# Patient Record
Sex: Female | Born: 1990 | Hispanic: Yes | Marital: Single | Smoking: Never smoker
Health system: Southern US, Community
[De-identification: ages and names within clinical notes are randomized; demographics above are authoritative.]

## PROBLEM LIST (undated history)

## (undated) DIAGNOSIS — Z789 Other specified health status: Secondary | ICD-10-CM

## (undated) HISTORY — PX: NO PAST SURGERIES: SHX2092

## (undated) HISTORY — DX: Other specified health status: Z78.9

---

## 2010-04-10 NOTE — L&D Delivery Note (Signed)
Delivery Note At 7:52 AM a viable female was delivered via Vaginal, Vacuum (Extractor) (Presentation: ; Occiput Anterior).  APGAR: , ; weight .   Placenta status: , .  Cord:  with the following complications: .  Cord pH: not done  Anesthesia: Local Epidural  Episiotomy: Median Lacerations: None Suture Repair: 2.0 vicryl Est. Blood Loss (mL):   Mom to postpartum.  Baby to nursery-stable.  Cam Hai 03/08/2011, 8:06 AM

## 2010-11-06 LAB — RPR: RPR: NONREACTIVE

## 2010-11-06 LAB — ABO/RH: RH Type: POSITIVE

## 2011-02-21 LAB — STREP B DNA PROBE: GBS: NEGATIVE

## 2011-03-06 ENCOUNTER — Inpatient Hospital Stay (HOSPITAL_COMMUNITY)
Admission: EM | Admit: 2011-03-06 | Discharge: 2011-03-10 | DRG: 775 | Disposition: A | Discharge status: Home / Self Care | Payer: Medicaid Other | Attending: Obstetrics | Admitting: Obstetrics

## 2011-03-06 ENCOUNTER — Emergency Department (HOSPITAL_COMMUNITY): Payer: Medicaid Other

## 2011-03-06 ENCOUNTER — Inpatient Hospital Stay (HOSPITAL_COMMUNITY): Payer: Medicaid Other

## 2011-03-06 DIAGNOSIS — S161XXA Strain of muscle, fascia and tendon at neck level, initial encounter: Secondary | ICD-10-CM

## 2011-03-06 DIAGNOSIS — R109 Unspecified abdominal pain: Secondary | ICD-10-CM

## 2011-03-06 DIAGNOSIS — O9A219 Injury, poisoning and certain other consequences of external causes complicating pregnancy, unspecified trimester: Secondary | ICD-10-CM

## 2011-03-06 LAB — TYPE AND SCREEN: Antibody Screen: NEGATIVE

## 2011-03-06 LAB — CBC
MCHC: 35.1 g/dL (ref 30.0–36.0)
Platelets: 241 10*3/uL (ref 150–400)
RDW: 12.2 % (ref 11.5–15.5)
WBC: 10.2 10*3/uL (ref 4.0–10.5)

## 2011-03-06 LAB — COMPREHENSIVE METABOLIC PANEL
ALT: 16 U/L (ref 0–35)
AST: 24 U/L (ref 0–37)
Albumin: 3 g/dL — ABNORMAL LOW (ref 3.5–5.2)
CO2: 21 mEq/L (ref 19–32)
Calcium: 9.7 mg/dL (ref 8.4–10.5)
Chloride: 105 mEq/L (ref 96–112)
GFR calc non Af Amer: >90 mL/min (ref >90)
Sodium: 137 mEq/L (ref 135–145)
Total Bilirubin: 0.1 mg/dL — ABNORMAL LOW (ref 0.3–1.2)

## 2011-03-06 LAB — DIFFERENTIAL
Basophils Absolute: 0 10*3/uL (ref 0.0–0.1)
Basophils Relative: 0 % (ref 0–1)
Lymphocytes Relative: 15 % (ref 12–46)
Neutro Abs: 7.7 10*3/uL (ref 1.7–7.7)

## 2011-03-06 LAB — ABO/RH: ABO/RH(D): O POS

## 2011-03-06 LAB — KLEIHAUER-BETKE STAIN: Quantitation Fetal Hemoglobin: 0 mL

## 2011-03-06 MED ORDER — ACETAMINOPHEN 325 MG PO TABS: 650.0000 mg | ORAL_TABLET | ORAL | Status: DC | PRN

## 2011-03-06 MED ORDER — PRENATAL PLUS 27-1 MG PO TABS
1.0000 | ORAL_TABLET | Freq: Every day | ORAL | Status: DC
  Administered 2011-03-07 – 2011-03-10 (×3): 1 via ORAL
  Filled 2011-03-06 (×3): qty 1

## 2011-03-06 MED ORDER — SODIUM CHLORIDE 0.9 % IV SOLN: 250.0000 mL | INTRAVENOUS | Status: DC | PRN

## 2011-03-06 MED ORDER — SODIUM CHLORIDE 0.9 % IJ SOLN
3.0000 mL | Freq: Two times a day (BID) | INTRAMUSCULAR | Status: DC
  Administered 2011-03-06 – 2011-03-07 (×3): 3 mL via INTRAVENOUS

## 2011-03-06 MED ORDER — CALCIUM CARBONATE ANTACID 500 MG PO CHEW: 2.0000 | CHEWABLE_TABLET | ORAL | Status: DC | PRN

## 2011-03-06 MED ORDER — SODIUM CHLORIDE 0.9 % IJ SOLN: 3.0000 mL | INTRAMUSCULAR | Status: DC | PRN
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
## 2011-03-06 NOTE — Progress Notes (Signed)
Through phone interpreter history obtained. Pt states she [facility]. Pt denies medical history or prenatal history of complications. Pt denies pain and no tenderness with palpation. Pt denies feeling contractions or low back pain. Prenatal record reviewed. Pt denies questions or complaints at this time.

## 2011-03-06 NOTE — Progress Notes (Signed)
Arrived at bedside in room 2 in [facility] ED. Doppler of FHR before pt to xray 135. Pt tearful but denies abd tenderness and mild abd pain. Abd palpates soft. Pt denies feeling FM at this time.
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
## 2011-03-06 NOTE — ED Notes (Signed)
[facility] at bedside. Report given. Paperwork to [facility]link. Vitals remain stable.
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
## 2011-03-06 NOTE — ED Notes (Signed)
Rapid Response OB nurse contacted

## 2011-03-07 MED ORDER — INFLUENZA VIRUS VACC SPLIT PF IM SUSP
0.5000 mL | INTRAMUSCULAR | Status: AC
  Administered 2011-03-09: 0.5 mL via INTRAMUSCULAR
  Filled 2011-03-07 (×2): qty 0.5

## 2011-03-07 NOTE — ED Provider Notes (Signed)
I saw and evaluated the patient, reviewed the resident's note and I agree with the findings and plan.  I was present for entire limited ED [place] FAST and OB noting no apparent free fluid abd and FHR 130s.  Hurman Horn, MD 03/07/11 [phone]

## 2011-03-07 NOTE — Progress Notes (Signed)
UR Chart review completed.  

## 2011-03-08 ENCOUNTER — Inpatient Hospital Stay (HOSPITAL_COMMUNITY): Payer: Medicaid Other | Admitting: Anesthesiology

## 2011-03-08 ENCOUNTER — Encounter (HOSPITAL_COMMUNITY): Payer: Self-pay | Admitting: *Deleted

## 2011-03-08 ENCOUNTER — Encounter (HOSPITAL_COMMUNITY): Payer: Self-pay | Admitting: Anesthesiology

## 2011-03-08 LAB — CBC
Hemoglobin: 12.1 g/dL (ref 12.0–15.0)
MCH: 31.7 pg (ref 26.0–34.0)
MCHC: 34.4 g/dL (ref 30.0–36.0)
RDW: 12.3 % (ref 11.5–15.5)

## 2011-03-08 LAB — RPR: RPR Ser Ql: NONREACTIVE

## 2011-03-08 MED ORDER — OXYCODONE-ACETAMINOPHEN 5-325 MG PO TABS
2.0000 | ORAL_TABLET | ORAL | Status: DC | PRN
  Administered 2011-03-08: 1 via ORAL
  Administered 2011-03-09: 2 via ORAL
  Filled 2011-03-08 (×2): qty 1
  Filled 2011-03-08: qty 2
  Filled 2011-03-08 (×3): qty 1

## 2011-03-08 MED ORDER — IBUPROFEN 600 MG PO TABS
600.0000 mg | ORAL_TABLET | Freq: Four times a day (QID) | ORAL | Status: DC | PRN
  Administered 2011-03-08: 600 mg via ORAL
  Filled 2011-03-08: qty 1

## 2011-03-08 MED ORDER — OXYCODONE-ACETAMINOPHEN 5-325 MG PO TABS
1.0000 | ORAL_TABLET | ORAL | Status: DC | PRN
  Administered 2011-03-08 – 2011-03-10 (×4): 1 via ORAL
  Filled 2011-03-08: qty 1

## 2011-03-08 MED ORDER — LACTATED RINGERS IV SOLN
INTRAVENOUS | Status: DC
  Administered 2011-03-08 (×2): via INTRAVENOUS

## 2011-03-08 MED ORDER — LANOLIN HYDROUS EX OINT: TOPICAL_OINTMENT | CUTANEOUS | Status: DC | PRN

## 2011-03-08 MED ORDER — OXYTOCIN BOLUS FROM INFUSION
500.0000 mL | Freq: Once | INTRAVENOUS | Status: DC
  Filled 2011-03-08: qty 1000
  Filled 2011-03-08: qty 500

## 2011-03-08 MED ORDER — BENZOCAINE-MENTHOL 20-0.5 % EX AERO
INHALATION_SPRAY | CUTANEOUS | Status: AC
  Filled 2011-03-08: qty 56

## 2011-03-08 MED ORDER — OXYTOCIN 20 UNITS IN LACTATED RINGERS INFUSION - SIMPLE
1.0000 m[IU]/min | INTRAVENOUS | Status: DC
  Administered 2011-03-08: 1 m[IU]/min via INTRAVENOUS

## 2011-03-08 MED ORDER — PHENYLEPHRINE 40 MCG/ML (10ML) SYRINGE FOR IV PUSH (FOR BLOOD PRESSURE SUPPORT)
80.0000 ug | PREFILLED_SYRINGE | INTRAVENOUS | Status: DC | PRN
  Filled 2011-03-08: qty 5

## 2011-03-08 MED ORDER — FENTANYL 2.5 MCG/ML BUPIVACAINE 1/10 % EPIDURAL INFUSION (WH - ANES)
INTRAMUSCULAR | Status: DC | PRN
  Administered 2011-03-08: 12 mL/h via EPIDURAL

## 2011-03-08 MED ORDER — ZOLPIDEM TARTRATE 5 MG PO TABS: 5.0000 mg | ORAL_TABLET | Freq: Every evening | ORAL | Status: DC | PRN

## 2011-03-08 MED ORDER — PROMETHAZINE HCL 25 MG/ML IJ SOLN: 12.5000 mg | INTRAMUSCULAR | Status: DC | PRN

## 2011-03-08 MED ORDER — ONDANSETRON HCL 4 MG/2ML IJ SOLN: 4.0000 mg | INTRAMUSCULAR | Status: DC | PRN

## 2011-03-08 MED ORDER — ONDANSETRON HCL 4 MG/2ML IJ SOLN: 4.0000 mg | Freq: Four times a day (QID) | INTRAMUSCULAR | Status: DC | PRN

## 2011-03-08 MED ORDER — TETANUS-DIPHTH-ACELL PERTUSSIS 5-2.5-18.5 LF-MCG/0.5 IM SUSP
0.5000 mL | Freq: Once | INTRAMUSCULAR | Status: AC
  Administered 2011-03-09: 0.5 mL via INTRAMUSCULAR
  Filled 2011-03-08: qty 0.5

## 2011-03-08 MED ORDER — LIDOCAINE HCL (PF) 1 % IJ SOLN
30.0000 mL | INTRAMUSCULAR | Status: DC | PRN
  Filled 2011-03-08: qty 30

## 2011-03-08 MED ORDER — SENNOSIDES-DOCUSATE SODIUM 8.6-50 MG PO TABS
2.0000 | ORAL_TABLET | Freq: Every day | ORAL | Status: DC
  Administered 2011-03-08 – 2011-03-09 (×2): 2 via ORAL

## 2011-03-08 MED ORDER — PRENATAL PLUS 27-1 MG PO TABS: 1.0000 | ORAL_TABLET | Freq: Every day | ORAL | Status: DC

## 2011-03-08 MED ORDER — DIPHENHYDRAMINE HCL 25 MG PO CAPS: 25.0000 mg | ORAL_CAPSULE | Freq: Four times a day (QID) | ORAL | Status: DC | PRN

## 2011-03-08 MED ORDER — BENZOCAINE-MENTHOL 20-0.5 % EX AERO
1.0000 "application " | INHALATION_SPRAY | CUTANEOUS | Status: DC | PRN
  Administered 2011-03-08: 1 via TOPICAL

## 2011-03-08 MED ORDER — LACTATED RINGERS IV SOLN: 500.0000 mL | Freq: Once | INTRAVENOUS | Status: DC

## 2011-03-08 MED ORDER — ACETAMINOPHEN 325 MG PO TABS: 650.0000 mg | ORAL_TABLET | ORAL | Status: DC | PRN

## 2011-03-08 MED ORDER — CITRIC ACID-SODIUM CITRATE 334-500 MG/5ML PO SOLN: 30.0000 mL | ORAL | Status: DC | PRN

## 2011-03-08 MED ORDER — DIBUCAINE 1 % RE OINT: 1.0000 "application " | TOPICAL_OINTMENT | RECTAL | Status: DC | PRN

## 2011-03-08 MED ORDER — EPHEDRINE 5 MG/ML INJ: 10.0000 mg | INTRAVENOUS | Status: DC | PRN

## 2011-03-08 MED ORDER — WITCH HAZEL-GLYCERIN EX PADS: 1.0000 "application " | MEDICATED_PAD | CUTANEOUS | Status: DC | PRN

## 2011-03-08 MED ORDER — TERBUTALINE SULFATE 1 MG/ML IJ SOLN: 0.2500 mg | Freq: Once | INTRAMUSCULAR | Status: DC | PRN

## 2011-03-08 MED ORDER — IBUPROFEN 600 MG PO TABS
600.0000 mg | ORAL_TABLET | Freq: Four times a day (QID) | ORAL | Status: DC
  Administered 2011-03-08 – 2011-03-10 (×9): 600 mg via ORAL
  Filled 2011-03-08 (×9): qty 1

## 2011-03-08 MED ORDER — SIMETHICONE 80 MG PO CHEW: 80.0000 mg | CHEWABLE_TABLET | ORAL | Status: DC | PRN

## 2011-03-08 MED ORDER — ONDANSETRON HCL 4 MG PO TABS: 4.0000 mg | ORAL_TABLET | ORAL | Status: DC | PRN

## 2011-03-08 MED ORDER — DIPHENHYDRAMINE HCL 50 MG/ML IJ SOLN: 12.5000 mg | INTRAMUSCULAR | Status: DC | PRN

## 2011-03-08 MED ORDER — BUTORPHANOL TARTRATE 2 MG/ML IJ SOLN: 1.0000 mg | INTRAMUSCULAR | Status: DC | PRN

## 2011-03-08 MED ORDER — FENTANYL 2.5 MCG/ML BUPIVACAINE 1/10 % EPIDURAL INFUSION (WH - ANES)
14.0000 mL/h | INTRAMUSCULAR | Status: DC
  Administered 2011-03-08: 14 mL/h via EPIDURAL
  Filled 2011-03-08 (×2): qty 60

## 2011-03-08 MED ORDER — OXYTOCIN 20 UNITS IN LACTATED RINGERS INFUSION - SIMPLE: 125.0000 mL/h | Freq: Once | INTRAVENOUS | Status: DC

## 2011-03-08 MED ORDER — EPHEDRINE 5 MG/ML INJ
10.0000 mg | INTRAVENOUS | Status: DC | PRN
  Filled 2011-03-08: qty 4

## 2011-03-08 MED ORDER — LACTATED RINGERS IV SOLN
500.0000 mL | INTRAVENOUS | Status: DC | PRN
Start: 2011-03-08 — End: 2011-03-08

## 2011-03-08 MED ORDER — PHENYLEPHRINE 40 MCG/ML (10ML) SYRINGE FOR IV PUSH (FOR BLOOD PRESSURE SUPPORT): 80.0000 ug | PREFILLED_SYRINGE | INTRAVENOUS | Status: DC | PRN

## 2011-03-08 MED ORDER — FLEET ENEMA 7-19 GM/118ML RE ENEM: 1.0000 | ENEMA | RECTAL | Status: DC | PRN

## 2011-03-08 NOTE — Progress Notes (Signed)
Transferred to 162
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
## 2011-03-08 NOTE — Progress Notes (Signed)
[facility] Department of Clinical Social Work Documentation of Interpretation   I assisted _Michelle RN__________________ with interpretation of ____teaching__________________ for this patient.

## 2011-03-08 NOTE — Anesthesia Procedure Notes (Signed)

## 2011-03-08 NOTE — Progress Notes (Signed)
[facility] Department of Clinical Social Work Documentation of Interpretation   I assisted __Dr. Marshall_________________ with interpretation of __delivery____________________ for this patient.

## 2011-03-08 NOTE — Anesthesia Preprocedure Evaluation (Signed)

## 2011-03-09 NOTE — Anesthesia Postprocedure Evaluation (Signed)
  Anesthesia Post-op Note  Patient: Tina Anthony  Procedure(s) Performed: * No procedures listed *  Patient Location: Mother/Baby  Anesthesia Type: Epidural  Level of Consciousness: alert  and oriented  Airway and Oxygen Therapy: Patient Spontanous Breathing  Post-op Pain: mild  Post-op Assessment: Patient's Cardiovascular Status Stable and Respiratory Function Stable  Post-op Vital Signs: stable  Complications: No apparent anesthesia complications

## 2011-03-09 NOTE — Progress Notes (Signed)
[facility] Department of Clinical Social Work Documentation of Interpretation   I assisted ___Patient________________ with interpretation of ___lunch order___________________ for this patient.

## 2011-03-09 NOTE — Progress Notes (Signed)
Patient ID: Tina Anthony, female   DOB: Apr 30, 1990, 20 y.o.   MRN: [id number] Postpartum day one Vital signs normal Fundus firm Lochia moderate Legs negative No complaints

## 2011-03-09 NOTE — Addendum Note (Signed)
Addendum  created 03/09/11 1025 by Cephus Shelling   Modules edited:Charges VN, Notes Section

## 2011-03-10 NOTE — Discharge Summary (Signed)
Obstetric Discharge Summary Reason for Admission: onset of labor Prenatal Procedures: none Intrapartum Procedures: spontaneous vaginal delivery Postpartum Procedures: none Complications-Operative and Postpartum: none Hemoglobin  Date Value Range Status  03/08/2011 12.1  12.0-15.0 (g/dL) Final     HCT  Date Value Range Status  03/08/2011 35.2* 36.0-46.0 (%) Final    Discharge Diagnoses: Term Pregnancy-delivered  Discharge Information: Date: 03/10/2011 Activity: pelvic rest Diet: routine Medications: Tylenol #3 Condition: stable Instructions: refer to practice specific booklet Discharge to: home Follow-up Information    Follow up with [name], MD. Call in 6 weeks.   Contact information:   [address] [place] [address] [phone]          Newborn Data: Live born female  Birth Weight: 5 lb 9.8 oz (2546 g) APGAR: 9, 9  Home with mother.  Tina Anthony A 03/10/2011, 6:00 AM

## 2011-03-10 NOTE — Progress Notes (Signed)
Patient ID: Tina Anthony, female   DOB: [birth year], 20 y.o.   MRN: [id number] Postpartum day 2 Vital signs normal Fundus firm Lochia moderate Legs negative No complaints home today

## 2011-03-10 NOTE — Progress Notes (Signed)
UR chart review completed.  

## 2011-03-20 ENCOUNTER — Inpatient Hospital Stay (HOSPITAL_COMMUNITY): Admission: AD | Admit: 2011-03-20 | Payer: Self-pay | Source: Ambulatory Visit | Admitting: Obstetrics

## 2013-08-14 ENCOUNTER — Encounter (HOSPITAL_COMMUNITY): Payer: Self-pay | Admitting: Emergency Medicine

## 2013-08-14 ENCOUNTER — Emergency Department (HOSPITAL_COMMUNITY)
Admission: EM | Admit: 2013-08-14 | Discharge: 2013-08-14 | Disposition: A | Discharge status: Home / Self Care | Payer: Self-pay | Attending: Emergency Medicine | Admitting: Emergency Medicine

## 2013-08-14 ENCOUNTER — Emergency Department (HOSPITAL_COMMUNITY): Payer: Medicaid Other

## 2013-08-14 DIAGNOSIS — R1011 Right upper quadrant pain: Secondary | ICD-10-CM | POA: Insufficient documentation

## 2013-08-14 DIAGNOSIS — Z3202 Encounter for pregnancy test, result negative: Secondary | ICD-10-CM | POA: Insufficient documentation

## 2013-08-14 DIAGNOSIS — R109 Unspecified abdominal pain: Secondary | ICD-10-CM

## 2013-08-14 DIAGNOSIS — Z79899 Other long term (current) drug therapy: Secondary | ICD-10-CM | POA: Insufficient documentation

## 2013-08-14 DIAGNOSIS — R110 Nausea: Secondary | ICD-10-CM | POA: Insufficient documentation

## 2013-08-14 LAB — CBC WITH DIFFERENTIAL/PLATELET
BASOS PCT: 0 % (ref 0–1)
Basophils Absolute: 0 10*3/uL (ref 0.0–0.1)
Eosinophils Absolute: 2.5 10*3/uL — ABNORMAL HIGH (ref 0.0–0.7)
Eosinophils Relative: 24 % — ABNORMAL HIGH (ref 0–5)
HEMATOCRIT: 40.4 % (ref 36.0–46.0)
HEMOGLOBIN: 13.8 g/dL (ref 12.0–15.0)
LYMPHS PCT: 24 % (ref 12–46)
Lymphs Abs: 2.5 10*3/uL (ref 0.7–4.0)
MCH: 29.3 pg (ref 26.0–34.0)
MCHC: 34.2 g/dL (ref 30.0–36.0)
MCV: 85.8 fL (ref 78.0–100.0)
MONO ABS: 0.7 10*3/uL (ref 0.1–1.0)
MONOS PCT: 7 % (ref 3–12)
NEUTROS ABS: 4.6 10*3/uL (ref 1.7–7.7)
Neutrophils Relative %: 45 % (ref 43–77)
Platelets: 234 10*3/uL (ref 150–400)
RBC: 4.71 MIL/uL (ref 3.87–5.11)
RDW: 12.9 % (ref 11.5–15.5)
WBC: 10.4 10*3/uL (ref 4.0–10.5)

## 2013-08-14 LAB — URINALYSIS, ROUTINE W REFLEX MICROSCOPIC
BILIRUBIN URINE: NEGATIVE
Glucose, UA: NEGATIVE mg/dL
Hgb urine dipstick: NEGATIVE
KETONES UR: NEGATIVE mg/dL
Leukocytes, UA: NEGATIVE
NITRITE: NEGATIVE
Protein, ur: NEGATIVE mg/dL
Specific Gravity, Urine: 1.01 (ref 1.005–1.030)
UROBILINOGEN UA: 0.2 mg/dL (ref 0.0–1.0)
pH: 6.5 (ref 5.0–8.0)

## 2013-08-14 LAB — PREGNANCY, URINE: PREG TEST UR: NEGATIVE

## 2013-08-14 LAB — COMPREHENSIVE METABOLIC PANEL
ALT: 12 U/L (ref 0–35)
AST: 16 U/L (ref 0–37)
Albumin: 4 g/dL (ref 3.5–5.2)
Alkaline Phosphatase: 87 U/L (ref 39–117)
BUN: 15 mg/dL (ref 6–23)
CHLORIDE: 108 meq/L (ref 96–112)
CO2: 21 meq/L (ref 19–32)
CREATININE: 0.59 mg/dL (ref 0.50–1.10)
Calcium: 9.4 mg/dL (ref 8.4–10.5)
Glucose, Bld: 98 mg/dL (ref 70–99)
Potassium: 4 mEq/L (ref 3.7–5.3)
Sodium: 142 mEq/L (ref 137–147)
Total Protein: 7.3 g/dL (ref 6.0–8.3)

## 2013-08-14 LAB — LIPASE, BLOOD: LIPASE: 49 U/L (ref 11–59)

## 2013-08-14 MED ORDER — ONDANSETRON HCL 4 MG/2ML IJ SOLN
4.0000 mg | Freq: Once | INTRAMUSCULAR | Status: AC
  Administered 2013-08-14: 4 mg via INTRAVENOUS
  Filled 2013-08-14: qty 2

## 2013-08-14 MED ORDER — OXYCODONE-ACETAMINOPHEN 5-325 MG PO TABS: 2.0000 | ORAL_TABLET | ORAL | Status: DC | PRN

## 2013-08-14 MED ORDER — ONDANSETRON 8 MG PO TBDP: 8.0000 mg | ORAL_TABLET | Freq: Three times a day (TID) | ORAL | Status: DC | PRN

## 2013-08-14 MED ORDER — MORPHINE SULFATE 4 MG/ML IJ SOLN
4.0000 mg | Freq: Once | INTRAMUSCULAR | Status: AC
  Administered 2013-08-14: 4 mg via INTRAVENOUS
  Filled 2013-08-14: qty 1

## 2013-08-14 MED ORDER — SUCRALFATE 1 G PO TABS: 1.0000 g | ORAL_TABLET | Freq: Four times a day (QID) | ORAL | Status: DC

## 2013-08-14 MED ORDER — LANSOPRAZOLE 30 MG PO CPDR: 30.0000 mg | DELAYED_RELEASE_CAPSULE | Freq: Every day | ORAL | Status: DC

## 2013-08-14 NOTE — ED Notes (Signed)
Per interpretor per pt, pt c/o diffuse abdominal pain that started a month ago, pt seen and prescribed tramadol and ranitidine, pt states the medicine helps her pain but as soon as the medicine wears off her pain returns. Pt report nausea without vomiting and denies diarrhea. Pt reports pain 10/10. Pt denies urinary issues or any vaginal bleeding/discharge. Pt is A&O X4.

## 2013-08-14 NOTE — ED Notes (Signed)
MD at bedside. 

## 2013-08-14 NOTE — Discharge Instructions (Signed)
Your workup today did not show a specific cause of your abdominal pain.  No gallstones were seen on your ultrasound.  You will need further workup for your symptoms.  Take medications as prescribed.  Stick to a bland diet until feeling better.  Follow up with the numbers listed above.  Return to the ER for fever, nausea and vomiting despite medications, or other new concerning symptoms.  Su diagnstico diferencial hoy no mostr una causa especfica de su dolor abdominal. No hay clculos biliares fueron vistos en su ultrasonido. Usted necesitar ms estudio diagnstico para sus sntomas. Tome los medicamentos segn las indicaciones. Siga una dieta blanda hasta que se sienta mejor. Haga un seguimiento con los nmeros indicados anteriormente. Regreso a la sala de emergencia para la fiebre , nuseas y vmitos a pesar de los medicamentos , u otros nuevos relativos a los sntomas.   Dolor abdominal en adultos (Abdominal Pain, Adult) El dolor puede tener muchas causas. Normalmente la causa del dolor abdominal no es una enfermedad y Scientist, clinical (histocompatibility and immunogenetics)mejorar sin TEFL teachertratamiento. Frecuentemente puede controlarse y tratarse en casa. Su mdico le Medical sales representativerealizar un examen fsico y posiblemente solicite anlisis de sangre y radiografas para ayudar a Chief Strategy Officerdeterminar la gravedad de su dolor. Sin embargo, en IAC/InterActiveCorpmuchos casos, debe transcurrir ms tiempo antes de que se pueda Clinical research associateencontrar una causa evidente del dolor. Antes de llegar a ese punto, es posible que su mdico no sepa si necesita ms pruebas o un tratamiento ms profundo. INSTRUCCIONES PARA EL CUIDADO EN EL HOGAR  Est atento al dolor para ver si hay cambios. Las siguientes indicaciones ayudarn a Architectural technologistaliviar cualquier molestia que pueda sentir:  Minotome solo medicamentos de venta libre o recetados, segn las indicaciones del mdico.  No tome laxantes a menos que se lo haya indicado su mdico.  Pruebe con Neomia Dearuna dieta lquida absoluta (caldo, t o agua) segn se lo indique su mdico. Introduzca  gradualmente una dieta normal, segn su tolerancia. SOLICITE ATENCIN MDICA SI:  Tiene dolor abdominal sin explicacin.  Tiene dolor abdominal relacionado con nuseas o diarrea.  Tiene dolor cuando orina o defeca.  Experimenta dolor abdominal que lo despierta de noche.  Tiene dolor abdominal que empeora o mejora cuando come alimentos.  Tiene dolor abdominal que empeora cuando come alimentos grasosos. SOLICITE ATENCIN MDICA DE INMEDIATO SI:   El dolor no desaparece en un plazo mximo de 2horas.  Tiene fiebre.  No deja de (vomitar).  El Engineer, miningdolor se siente solo en partes del abdomen, como el lado derecho o la parte inferior izquierda del abdomen.  Evaca materia fecal sanguinolenta o negra, de aspecto alquitranado. ASEGRESE DE QUE:  Comprende estas instrucciones.  Controlar su afeccin.  Recibir ayuda de inmediato si no mejora o si empeora. Document Released: 03/27/2005 Document Revised: 01/15/2013 [facility] Patient Information 2014 [place], [place].

## 2013-08-14 NOTE — ED Provider Notes (Signed)
CSN: [id number]     Arrival date & time 08/14/13  0438 History   First MD Initiated Contact with Patient 08/14/13 [phone]     Chief Complaint  Patient presents with  . Abdominal Pain     (Consider location/radiation/quality/duration/timing/severity/associated sxs/prior Treatment) HPI 23 year old female presents to emergency room with complaint of one month of abdominal pain.  Patient was seen through the [facility].  She was prescribed tramadol and ranitidine.  Patient was supposed to have an ultrasound done, but was never called back.  She denies any fever or chills.  She has nausea but no vomiting.  No diarrhea.  Pain is epigastric and right upper quadrant.  Pain is worse after eating.  Pain is sharp and stabbing and some radiation into her back.  No prior history of same.  Denies any previous surgeries.  Patient receives Depo, last injection was in January. History reviewed. No pertinent past medical history. History reviewed. No pertinent past surgical history. No family history on file. History  Substance Use Topics  . Smoking status: Never Smoker   . Smokeless tobacco: Not on file  . Alcohol Use: No   OB History   Grav Para Term Preterm Abortions TAB SAB Ect Mult Living   1 1 1  0 0 0 0 0 0 1     Review of Systems  See History of Present Illness; otherwise all other systems are reviewed and negative   Allergies  Review of patient's allergies indicates no known allergies.  Home Medications   Prior to Admission medications   Medication Sig Start Date End Date Taking? Authorizing Provider  ranitidine (ZANTAC) 150 MG tablet Take 150 mg by mouth 2 (two) times daily.   Yes Historical Provider, MD  traMADol (ULTRAM) 50 MG tablet Take by mouth 2 (two) times daily as needed for moderate pain.   Yes Historical Provider, MD   BP 115/66  Pulse 60  Resp 20  Wt 133 lb (60.328 kg)  SpO2 100%  LMP 08/08/2012  Breastfeeding? No Physical Exam  Nursing note and vitals  reviewed. Constitutional: She is oriented to person, place, and time. She appears well-developed and well-nourished. She appears distressed.  HENT:  Head: Normocephalic and atraumatic.  Nose: Nose normal.  Mouth/Throat: Oropharynx is clear and moist.  Eyes: Conjunctivae and EOM are normal. Pupils are equal, round, and reactive to light.  Neck: Normal range of motion. Neck supple. No JVD present. No tracheal deviation present. No thyromegaly present.  Cardiovascular: Normal rate, regular rhythm, normal heart sounds and intact distal pulses.  Exam reveals no gallop and no friction rub.   No murmur heard. Pulmonary/Chest: Effort normal and breath sounds normal. No stridor. No respiratory distress. She has no wheezes. She has no rales. She exhibits no tenderness.  Abdominal: Soft. Bowel sounds are normal. She exhibits no distension and no mass. There is tenderness (patient has positive Murphy sign, extremely tender in right upper quadrant and epigastrium.). There is no rebound and no guarding.  Musculoskeletal: Normal range of motion. She exhibits no edema and no tenderness.  Lymphadenopathy:    She has no cervical adenopathy.  Neurological: She is alert and oriented to person, place, and time. She exhibits normal muscle tone. Coordination normal.  Skin: Skin is warm and dry. No rash noted. No erythema. No pallor.  Psychiatric: She has a normal mood and affect. Her behavior is normal. Judgment and thought content normal.    ED Course  Procedures (including critical care time) Labs Review Labs  Reviewed  CBC WITH DIFFERENTIAL - Abnormal; Notable for the following:    Eosinophils Relative 24 (*)    Eosinophils Absolute 2.5 (*)    All other components within normal limits  COMPREHENSIVE METABOLIC PANEL - Abnormal; Notable for the following:    Total Bilirubin <0.2 (*)    All other components within normal limits  LIPASE, BLOOD  PREGNANCY, URINE  URINALYSIS, ROUTINE W REFLEX MICROSCOPIC     Imaging Review [place] Abdomen Limited  08/14/2013   CLINICAL DATA:  Epigastric and right upper quadrant pain, worse with meals. Evaluate for gallstone.  EXAM: US ABDOMEN LIMITED - RIGHT UPPER QUADRANT  COMPARISON:  None.  FINDINGS: Gallbladder:  No gallstones or wall thickening visualized. No sonographic Murphy sign noted.  Common bile duct:  Diameter: 4 mm.  Where visualized, no filling defect.  Liver:  No focal lesion identified. Within normal limits in parenchymal echogenicity.Antegrade flow in the imaged portal venous system.  IMPRESSION: No cholelithiasis or other sonographic abnormality.   Electronically Signed   By: Tiburcio PeaJonathan  Watts M.D.   On: 08/14/2013 06:57     EKG Interpretation None      MDM   Final diagnoses:  Abdominal pain    23 year old female with a month of abdominal pain.  Concern for possible gallstones.  We'll plan for ultrasound of abdomen.  Pain nausea medicine to be given.  Labs drawn.  7:00 AM U/s without stones.  Labs unremarkable.  Will start on prevacid, carafate and refer to [facility], GI.  Olivia Mackielga M Pollyanna Levay, MD 08/14/13 [phone]

## 2013-08-19 ENCOUNTER — Encounter: Payer: Self-pay | Admitting: Internal Medicine

## 2013-10-14 ENCOUNTER — Ambulatory Visit: Payer: Medicaid Other | Admitting: Internal Medicine

## 2014-02-09 ENCOUNTER — Encounter (HOSPITAL_COMMUNITY): Payer: Self-pay | Admitting: Emergency Medicine

## 2016-06-20 ENCOUNTER — Emergency Department (HOSPITAL_COMMUNITY)
Admission: EM | Admit: 2016-06-20 | Discharge: 2016-06-20 | Disposition: A | Discharge status: Home / Self Care | Payer: Self-pay | Attending: Emergency Medicine | Admitting: Emergency Medicine

## 2016-06-20 ENCOUNTER — Encounter (HOSPITAL_COMMUNITY): Payer: Self-pay | Admitting: Emergency Medicine

## 2016-06-20 ENCOUNTER — Emergency Department (HOSPITAL_COMMUNITY): Payer: Self-pay

## 2016-06-20 DIAGNOSIS — M545 Low back pain, unspecified: Secondary | ICD-10-CM

## 2016-06-20 DIAGNOSIS — M25512 Pain in left shoulder: Secondary | ICD-10-CM

## 2016-06-20 DIAGNOSIS — Y939 Activity, unspecified: Secondary | ICD-10-CM | POA: Insufficient documentation

## 2016-06-20 DIAGNOSIS — M25562 Pain in left knee: Secondary | ICD-10-CM

## 2016-06-20 DIAGNOSIS — Y92410 Unspecified street and highway as the place of occurrence of the external cause: Secondary | ICD-10-CM | POA: Insufficient documentation

## 2016-06-20 DIAGNOSIS — Z79899 Other long term (current) drug therapy: Secondary | ICD-10-CM | POA: Insufficient documentation

## 2016-06-20 DIAGNOSIS — Y999 Unspecified external cause status: Secondary | ICD-10-CM | POA: Insufficient documentation

## 2016-06-20 MED ORDER — OXYCODONE-ACETAMINOPHEN 5-325 MG PO TABS
1.0000 | ORAL_TABLET | Freq: Once | ORAL | Status: AC
  Administered 2016-06-20: 1 via ORAL
  Filled 2016-06-20: qty 1

## 2016-06-20 MED ORDER — NAPROXEN 500 MG PO TABS: 500.0000 mg | ORAL_TABLET | Freq: Two times a day (BID) | ORAL | 0 refills | Status: DC
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
## 2016-06-20 NOTE — ED Notes (Signed)
Spanish interpreter used for entirety of triage.

## 2016-06-20 NOTE — ED Notes (Signed)
Patient transported to X-ray 
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
## 2016-07-06 ENCOUNTER — Emergency Department (HOSPITAL_COMMUNITY)
Admission: EM | Admit: 2016-07-06 | Discharge: 2016-07-06 | Disposition: A | Discharge status: Home / Self Care | Payer: Medicaid Other | Attending: Emergency Medicine | Admitting: Emergency Medicine

## 2016-07-06 ENCOUNTER — Emergency Department (HOSPITAL_COMMUNITY): Payer: Medicaid Other

## 2016-07-06 DIAGNOSIS — Y939 Activity, unspecified: Secondary | ICD-10-CM | POA: Insufficient documentation

## 2016-07-06 DIAGNOSIS — S161XXA Strain of muscle, fascia and tendon at neck level, initial encounter: Secondary | ICD-10-CM

## 2016-07-06 DIAGNOSIS — Y92410 Unspecified street and highway as the place of occurrence of the external cause: Secondary | ICD-10-CM | POA: Insufficient documentation

## 2016-07-06 DIAGNOSIS — S0990XA Unspecified injury of head, initial encounter: Secondary | ICD-10-CM | POA: Insufficient documentation

## 2016-07-06 DIAGNOSIS — Y999 Unspecified external cause status: Secondary | ICD-10-CM | POA: Insufficient documentation

## 2016-07-06 MED ORDER — NAPROXEN 500 MG PO TABS: 500.0000 mg | ORAL_TABLET | Freq: Two times a day (BID) | ORAL | 0 refills | Status: DC

## 2016-07-06 MED ORDER — OXYCODONE-ACETAMINOPHEN 5-325 MG PO TABS
1.0000 | ORAL_TABLET | Freq: Once | ORAL | Status: AC
  Administered 2016-07-06: 1 via ORAL
  Filled 2016-07-06: qty 1

## 2016-07-06 MED ORDER — METHOCARBAMOL 500 MG PO TABS: 500.0000 mg | ORAL_TABLET | Freq: Two times a day (BID) | ORAL | 0 refills | Status: DC

## 2016-07-06 NOTE — Discharge Instructions (Signed)
Take naproxen for pain and inflammation. Take Robaxin for muscle spasms. Try heating pads. Stretches. Follow-up with family doctor as needed.
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
## 2016-07-17 ENCOUNTER — Emergency Department (HOSPITAL_COMMUNITY)
Admission: EM | Admit: 2016-07-17 | Discharge: 2016-07-17 | Discharge status: Absent without leave | Payer: No Typology Code available for payment source

## 2016-09-27 LAB — AMB REFERRAL TO OB-GYN: Pap: NEGATIVE

## 2017-10-24 ENCOUNTER — Encounter (HOSPITAL_COMMUNITY): Payer: Self-pay | Admitting: *Deleted

## 2017-10-24 ENCOUNTER — Emergency Department (HOSPITAL_COMMUNITY)
Admission: EM | Admit: 2017-10-24 | Discharge: 2017-10-25 | Disposition: A | Discharge status: Home / Self Care | Payer: Self-pay | Attending: Emergency Medicine | Admitting: Emergency Medicine

## 2017-10-24 ENCOUNTER — Other Ambulatory Visit: Payer: Self-pay

## 2017-10-24 DIAGNOSIS — M542 Cervicalgia: Secondary | ICD-10-CM | POA: Insufficient documentation

## 2017-10-24 DIAGNOSIS — M545 Low back pain, unspecified: Secondary | ICD-10-CM

## 2017-10-24 DIAGNOSIS — M25562 Pain in left knee: Secondary | ICD-10-CM | POA: Insufficient documentation

## 2017-10-24 MED ORDER — CYCLOBENZAPRINE HCL 10 MG PO TABS: 10.0000 mg | ORAL_TABLET | Freq: Two times a day (BID) | ORAL | 0 refills | Status: DC | PRN

## 2017-10-24 MED ORDER — NAPROXEN 500 MG PO TABS: 500.0000 mg | ORAL_TABLET | Freq: Two times a day (BID) | ORAL | 0 refills | Status: DC
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
## 2017-10-24 NOTE — Discharge Instructions (Signed)
Take Naproxen for pain Take Flexeril for muscle pain/spasms Try a heating pad for sore muscles

## 2017-10-25 NOTE — ED Notes (Signed)
Discharge instructions reviewed with pt. Pt verbalized understanding. Pt to follow up with PCP. Pt ambulatory to waiting room.  
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
## 2018-02-04 LAB — OB RESULTS CONSOLE GC/CHLAMYDIA
Chlamydia: NEGATIVE
GC PROBE AMP, GENITAL: NEGATIVE

## 2018-02-04 LAB — OB RESULTS CONSOLE ANTIBODY SCREEN: Antibody Screen: NEGATIVE

## 2018-02-04 LAB — OB RESULTS CONSOLE ABO/RH: RH TYPE: POSITIVE

## 2018-02-04 LAB — OB RESULTS CONSOLE VARICELLA ZOSTER ANTIBODY, IGG: VARICELLA IGG: IMMUNE

## 2018-02-04 LAB — AMB REFERRAL TO OB-GYN
DRUG SCREEN, URINE: NEGATIVE
Glucose, 1 hour: 58
URINE CULTURE, OB: NO GROWTH

## 2018-02-04 LAB — OB RESULTS CONSOLE HGB/HCT, BLOOD
HCT: 37 (ref 29–41)
Hemoglobin: 12.4

## 2018-02-04 LAB — OB RESULTS CONSOLE HEPATITIS B SURFACE ANTIGEN: Hepatitis B Surface Ag: NEGATIVE

## 2018-02-04 LAB — OB RESULTS CONSOLE RPR: RPR: NONREACTIVE

## 2018-02-04 LAB — OB RESULTS CONSOLE HIV ANTIBODY (ROUTINE TESTING): HIV: NONREACTIVE

## 2018-02-05 ENCOUNTER — Other Ambulatory Visit (HOSPITAL_COMMUNITY): Payer: Self-pay | Admitting: Nurse Practitioner

## 2018-02-05 DIAGNOSIS — Z3A21 21 weeks gestation of pregnancy: Secondary | ICD-10-CM

## 2018-02-05 DIAGNOSIS — Z3689 Encounter for other specified antenatal screening: Secondary | ICD-10-CM

## 2018-02-11 ENCOUNTER — Encounter (HOSPITAL_COMMUNITY): Payer: Self-pay

## 2018-02-13 ENCOUNTER — Encounter (HOSPITAL_COMMUNITY): Payer: Self-pay | Admitting: *Deleted

## 2018-02-18 ENCOUNTER — Encounter: Payer: Self-pay | Admitting: Family Medicine

## 2018-02-18 ENCOUNTER — Ambulatory Visit (INDEPENDENT_AMBULATORY_CARE_PROVIDER_SITE_OTHER): Payer: Medicaid Other | Admitting: Family Medicine

## 2018-02-18 ENCOUNTER — Ambulatory Visit (HOSPITAL_COMMUNITY)
Admission: RE | Admit: 2018-02-18 | Discharge: 2018-02-18 | Disposition: A | Discharge status: Home / Self Care | Payer: Medicaid Other | Source: Ambulatory Visit | Attending: Nurse Practitioner | Admitting: Nurse Practitioner

## 2018-02-18 ENCOUNTER — Encounter (HOSPITAL_COMMUNITY): Payer: Self-pay

## 2018-02-18 VITALS — BP 117/66 | HR 71 | Wt 125.9 lb

## 2018-02-18 DIAGNOSIS — Z3689 Encounter for other specified antenatal screening: Secondary | ICD-10-CM | POA: Insufficient documentation

## 2018-02-18 DIAGNOSIS — Z3A21 21 weeks gestation of pregnancy: Secondary | ICD-10-CM | POA: Diagnosis not present

## 2018-02-18 DIAGNOSIS — O09292 Supervision of pregnancy with other poor reproductive or obstetric history, second trimester: Secondary | ICD-10-CM | POA: Diagnosis not present

## 2018-02-18 DIAGNOSIS — Z3A19 19 weeks gestation of pregnancy: Secondary | ICD-10-CM | POA: Diagnosis not present

## 2018-02-18 DIAGNOSIS — Z3482 Encounter for supervision of other normal pregnancy, second trimester: Secondary | ICD-10-CM

## 2018-02-18 DIAGNOSIS — O321XX0 Maternal care for breech presentation, not applicable or unspecified: Secondary | ICD-10-CM | POA: Insufficient documentation

## 2018-02-18 DIAGNOSIS — Z362 Encounter for other antenatal screening follow-up: Secondary | ICD-10-CM

## 2018-02-18 DIAGNOSIS — Z87898 Personal history of other specified conditions: Secondary | ICD-10-CM | POA: Insufficient documentation

## 2018-02-18 DIAGNOSIS — Z3480 Encounter for supervision of other normal pregnancy, unspecified trimester: Secondary | ICD-10-CM

## 2018-02-18 MED ORDER — PRENATAL VITAMIN 27-0.8 MG PO TABS: 1.0000 | ORAL_TABLET | Freq: Every day | ORAL | 3 refills | Status: DC
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
## 2018-03-18 ENCOUNTER — Encounter: Payer: Self-pay | Admitting: Advanced Practice Midwife

## 2018-03-18 ENCOUNTER — Encounter: Payer: Self-pay | Admitting: Obstetrics & Gynecology

## 2018-03-20 ENCOUNTER — Encounter: Payer: Self-pay | Admitting: Nurse Practitioner

## 2018-03-20 ENCOUNTER — Ambulatory Visit (INDEPENDENT_AMBULATORY_CARE_PROVIDER_SITE_OTHER): Payer: Self-pay | Admitting: Nurse Practitioner

## 2018-03-20 VITALS — BP 110/67 | HR 75 | Wt 130.0 lb

## 2018-03-20 DIAGNOSIS — Z3480 Encounter for supervision of other normal pregnancy, unspecified trimester: Secondary | ICD-10-CM

## 2018-03-20 DIAGNOSIS — Z789 Other specified health status: Secondary | ICD-10-CM | POA: Insufficient documentation

## 2018-03-20 DIAGNOSIS — Z3483 Encounter for supervision of other normal pregnancy, third trimester: Secondary | ICD-10-CM

## 2018-03-20 DIAGNOSIS — Z87898 Personal history of other specified conditions: Secondary | ICD-10-CM

## 2018-03-20 MED ORDER — PRENATAL 19 29-1 MG PO TABS: 1.0000 | ORAL_TABLET | Freq: Every day | ORAL | 2 refills | Status: AC

## 2018-03-20 MED ORDER — PRENATAL VITAMIN 27-0.8 MG PO TABS: 1.0000 | ORAL_TABLET | Freq: Every day | ORAL | 3 refills | Status: DC

## 2018-03-20 NOTE — Patient Instructions (Signed)
Náuseas matinales °(Morning Sickness) °Se denominan náuseas matinales a las ganas de vomitar (náuseas) durante el embarazo. Comienza a sentir náuseas y devuelve (vomita). Siente náuseas por la mañana, pero sigue sintiéndolas durante todo el día. Algunas mujeres experimentan náuseas intensas y no pueden detener los vómitos (hiperemesis gravídica). °CUIDADOS EN EL HOGAR °· Solo tome los medicamentos que le haya indicado su médico. °· Tome las multivitaminas según las indicaciones del médico. Tomar multivitaminas antes de quedar embarazada puede detener o disminuir el malestar de las náuseas matinales. °· Coma un trozo de tostada seca o galletas sin sal antes de levantarse de la cama por la mañana. °· Coma 5 o 6 comidas pequeñas por día. °· Consuma alimentos blandos y secos como arroz y patatas asadas. °· No beba líquidos con las comidas. Tome líquidos entre las comidas. °· No coma alimentos fritos, grasos o muy condimentados. °· Pídale a alguna persona que cocine para usted si el olor de las comidas le da náuseas o ganas de vomitar. °· Si tiene ganas de vomitar después de tomar las vitaminas prenatales, tómelas a la noche o con una colación. °· Consuma proteínas cuando necesite una colación (frutas secas, yogur, queso). °· Coma gelatina sin azúcar de postre. °· Use un brazalete para los mareos (pulsera de acupresión). °· Vaya a un especialista en colocar agujas finas en determinados puntos del cuerpo (acupuntura) para que usted se sienta mejor. °· No fume. °· Consiga un humidificador para mantener el aire de su casa libre de olores. °· Trate de respirar aire fresco. ° °SOLICITE AYUDA SI: °· Necesita medicamentos para sentirse mejor. °· Se siente mareada o sufre un desmayo. °· Pierde peso. ° °SOLICITE AYUDA DE INMEDIATO SI: °· Tiene malestar estomacal y no puede dejar de vomitar. °· Pierde el conocimiento (se desmaya). ° °ASEGÚRESE DE QUE: °· Comprende estas instrucciones. °· Controlará su afección. °· Recibirá ayuda  de inmediato si no mejora o si empeora. ° °Esta información no tiene como fin reemplazar el consejo del médico. Asegúrese de hacerle al médico cualquier pregunta que tenga. °Document Released: 03/27/2005 Document Revised: 04/17/2014 Document Reviewed: 09/11/2012 °Elsevier Interactive Patient Education © 2017 [employer] ° °

## 2018-03-20 NOTE — Progress Notes (Signed)
    Subjective:  Tina Anthony is a 27 y.o. G2P1001 at 6889w3d being seen today for ongoing prenatal care.  She is currently monitored for the following issues for this low-risk pregnancy and has Supervision of other normal pregnancy, antepartum and History of low birth weight infant on their problem list. Spanish interpreter present for the entire visit.  Patient reports needs PNV - requests ones from the [facility] Department as the others, OTC from the pharmacy, give her heartburn..  Contractions: Not present. Vag. Bleeding: None.  Movement: Absent. Denies leaking of fluid.   The following portions of the patient's history were reviewed and updated as appropriate: allergies, current medications, past family history, past medical history, past social history, past surgical history and problem list. Problem list updated.  Objective:   Vitals:   03/20/18 1332  BP: 110/67  Pulse: 75  Weight: 130 lb (59 kg)    Fetal Status: Fetal Heart Rate (bpm): 142 Fundal Height: 25 cm Movement: Absent     General:  Alert, oriented and cooperative. Patient is in no acute distress.  Skin: Skin is warm and dry. No rash noted.   Cardiovascular: Normal heart rate noted  Respiratory: Normal respiratory effort, no problems with respiration noted  Abdomen: Soft, gravid, appropriate for gestational age. Pain/Pressure: Absent     Pelvic:  Cervical exam deferred        Extremities: Normal range of motion.  Edema: None  Mental Status: Normal mood and affect. Normal behavior. Normal judgment and thought content.   Urinalysis:      Assessment and Plan:  Pregnancy: G2P1001 at 3489w3d  1. Supervision of other normal pregnancy, antepartum Printed prescrition for prenatal vitamins given to her to take to the [facility] for PNV  Will see again in 3 weeks and then will come every 2 weeks.  - Prenatal Vit-Fe Fumarate-FA (PRENATAL VITAMIN) 27-0.8 MG TABS; Take 1 tablet by mouth daily.  Dispense: 60  tablet; Refill: 3  2. History of low birth weight infant Has gained 5 pounds only and is now at her prepregnancy weight.  Has trouble eating without vomiting.  Reviewed protein sources and she is not able to eat many of them - vomiting with eating meat and eggs.  Advised small frequent meals.  Preterm labor symptoms and general obstetric precautions including but not limited to vaginal bleeding, contractions, leaking of fluid and fetal movement were reviewed in detail with the patient. Please refer to After Visit Summary for other counseling recommendations.  Return in about 3 weeks (around 04/10/2018).  Nolene BernheimERRI BURLESON, RN, MSN, NP-BC Nurse Practitioner, [facility], [facility] 03/20/2018 1:56 PM

## 2018-04-10 NOTE — L&D Delivery Note (Signed)
Patient: Tina Anthony MRN: [id number]  GBS status: negative, IAP: not indicated  Patient is a 28 y.o. now G2P2 s/p NSVD at [redacted]w[redacted]d, who was admitted for labor. AROM 0h 79m prior to delivery with moderate meconium stained fluid.    Delivery Note At 5:25 PM a viable female was delivered via Vaginal, Spontaneous (Presentation: cephalic; ROA).  APGAR: 9, 9; weight 6 lb 12.8 oz (3085 g).   Placenta status: intact, 3-vessel cord.  Cord:  with the following complications: nuchal and body cord.  Cord pH: not collected  Delivery call made due to meconium stained fluid.  In the several contractions leading up to delivery, there were recurrent deep decelerations, which we thought may necesitate vacuum delivery.  However, the patient pushed well, and vacuum was not needed.  Head delivered ROA. Nuchal and body cord present and delivered through. Shoulder and body delivered in usual fashion. Infant with spontaneous cry, placed on mother's abdomen, dried and bulb suctioned. Cord clamped x 2 after 1-minute delay, and cut by family member. Cord blood drawn. Placenta delivered spontaneously with gentle cord traction. Fundus firm with massage and Pitocin. Perineum inspected and found to have 1st degree laceration, which was repaired with 3.0 vicryl with good hemostasis achieved.  Anesthesia: None   Episiotomy: None Lacerations: 1st degree;Perineal Suture Repair: 3.0 vicryl Est. Blood Loss (mL):  50  Mom to postpartum.  Baby to Couplet care / Skin to Skin.  [name] 07/08/2018, 5:53 PM

## 2018-04-16 ENCOUNTER — Ambulatory Visit (INDEPENDENT_AMBULATORY_CARE_PROVIDER_SITE_OTHER): Payer: Self-pay | Admitting: Family Medicine

## 2018-04-16 VITALS — BP 118/75 | HR 70 | Wt 130.7 lb

## 2018-04-16 DIAGNOSIS — Z3A27 27 weeks gestation of pregnancy: Secondary | ICD-10-CM

## 2018-04-16 DIAGNOSIS — Z113 Encounter for screening for infections with a predominantly sexual mode of transmission: Secondary | ICD-10-CM

## 2018-04-16 DIAGNOSIS — N898 Other specified noninflammatory disorders of vagina: Secondary | ICD-10-CM

## 2018-04-16 DIAGNOSIS — O26899 Other specified pregnancy related conditions, unspecified trimester: Principal | ICD-10-CM

## 2018-04-16 DIAGNOSIS — Z3482 Encounter for supervision of other normal pregnancy, second trimester: Secondary | ICD-10-CM

## 2018-04-16 DIAGNOSIS — O26892 Other specified pregnancy related conditions, second trimester: Secondary | ICD-10-CM

## 2018-04-16 DIAGNOSIS — Z3480 Encounter for supervision of other normal pregnancy, unspecified trimester: Secondary | ICD-10-CM

## 2018-04-16 NOTE — Progress Notes (Signed)
   PRENATAL VISIT NOTE  Subjective:  Tina Anthony is a 28 y.o. G2P1001 at [redacted]w[redacted]d being seen today for ongoing prenatal care.  She is currently monitored for the following issues for this low-risk pregnancy and has Supervision of other normal pregnancy, antepartum; History of low birth weight infant; and Language barrier on their problem list.  Patient reports leakage of fluid, abdominal pain and decreased appetite. She states that she's had increased fluid for the last several weeks. Sometimes clear and sometimes white or yellow. No contractions or fevers. No vaginal irritation, itching or burning. Baby is moving well. Abdominal pain is upper abdomen, like a band going across. Usually followed by vigorous fetal movement. Not associated with food intake. Hasn't been taking any medications for it. Also having trouble eating. States that appetite is poor. No foods that really spark her interest.   Contractions: Not present. Vag. Bleeding: None.  Movement: Present. Denies leaking of fluid.   The following portions of the patient's history were reviewed and updated as appropriate: allergies, current medications, past family history, past medical history, past social history, past surgical history and problem list. Problem list updated.  Objective:   Vitals:   04/16/18 1506  BP: 118/75  Pulse: 70  Weight: 130 lb 11.2 oz (59.3 kg)    Fetal Status: Fetal Heart Rate (bpm): 133   Movement: Present     General:  Alert, oriented and cooperative. Patient is in no acute distress.  Skin: Skin is warm and dry. No rash noted.   Cardiovascular: Normal heart rate noted  Respiratory: Normal respiratory effort, no problems with respiration noted  Abdomen: Soft, gravid, appropriate for gestational age.  Pain/Pressure: Present     Pelvic: moderate amount of white yogurt like discharge in the vagina. Sample taken to perform fern test and swab sent to lab.         Extremities: Normal range of  motion.  Edema: None  Mental Status: Normal mood and affect. Normal behavior. Normal judgment and thought content.   Assessment and Plan:  Pregnancy: G2P1001 at [redacted]w[redacted]d  1. Vaginal discharge during pregnancy, antepartum - fern in office negative with valsalva - Cervicovaginal ancillary only( [place])  2. Supervision of other normal pregnancy, antepartum - CBC; Future - Glucose Tolerance, 2 Hours w/1 Hour; Future - HIV Antibody (routine testing w rflx); Future - RPR; Future - Comprehensive metabolic panel; Future  Preterm labor symptoms and general obstetric precautions including but not limited to vaginal bleeding, contractions, leaking of fluid and fetal movement were reviewed in detail with the patient. Please refer to After Visit Summary for other counseling recommendations.  Return for 1 week for 28 week labs. 2 weeks for ROB.  Future Appointments  Date Time Provider Department Center  04/23/2018  8:20 AM [facility] LAB [facility]  04/30/2018  3:15 PM Marny Lowenstein, PA-C [facility]    [name], MD

## 2018-04-17 LAB — CERVICOVAGINAL ANCILLARY ONLY
BACTERIAL VAGINITIS: NEGATIVE
CHLAMYDIA, DNA PROBE: NEGATIVE
Candida vaginitis: NEGATIVE
Neisseria Gonorrhea: NEGATIVE
TRICH (WINDOWPATH): NEGATIVE

## 2018-04-23 ENCOUNTER — Other Ambulatory Visit: Payer: Self-pay

## 2018-04-23 ENCOUNTER — Other Ambulatory Visit: Payer: Self-pay | Admitting: Family Medicine

## 2018-04-23 DIAGNOSIS — Z3480 Encounter for supervision of other normal pregnancy, unspecified trimester: Secondary | ICD-10-CM

## 2018-04-23 NOTE — Progress Notes (Unsigned)
Pt did not complete 2 hr GTT. Please reschedule.  [employer], CPT Phlebotomist

## 2018-04-24 LAB — CBC
Hematocrit: 34.9 % (ref 34.0–46.6)
Hemoglobin: 11.8 g/dL (ref 11.1–15.9)
MCH: 31.6 pg (ref 26.6–33.0)
MCHC: 33.8 g/dL (ref 31.5–35.7)
MCV: 93 fL (ref 79–97)
PLATELETS: 212 10*3/uL (ref 150–450)
RBC: 3.74 x10E6/uL — AB (ref 3.77–5.28)
RDW: 12.5 % (ref 11.7–15.4)
WBC: 7 10*3/uL (ref 3.4–10.8)

## 2018-04-24 LAB — COMPREHENSIVE METABOLIC PANEL
A/G RATIO: 1.6 (ref 1.2–2.2)
ALK PHOS: 85 IU/L (ref 39–117)
ALT: 18 IU/L (ref 0–32)
AST: 19 IU/L (ref 0–40)
Albumin: 3.9 g/dL (ref 3.5–5.5)
BUN/Creatinine Ratio: 11 (ref 9–23)
BUN: 7 mg/dL (ref 6–20)
Bilirubin Total: <0.2 mg/dL (ref 0.0–1.2)
CO2: 19 mmol/L — AB (ref 20–29)
Calcium: 8.8 mg/dL (ref 8.7–10.2)
Chloride: 103 mmol/L (ref 96–106)
Creatinine, Ser: 0.62 mg/dL (ref 0.57–1.00)
GFR calc Af Amer: 142 mL/min/{1.73_m2} (ref >59)
GFR calc non Af Amer: 123 mL/min/{1.73_m2} (ref >59)
GLOBULIN, TOTAL: 2.4 g/dL (ref 1.5–4.5)
Glucose: 70 mg/dL (ref 65–99)
POTASSIUM: 3.9 mmol/L (ref 3.5–5.2)
SODIUM: 138 mmol/L (ref 134–144)
Total Protein: 6.3 g/dL (ref 6.0–8.5)

## 2018-04-24 LAB — HIV ANTIBODY (ROUTINE TESTING W REFLEX): HIV Screen 4th Generation wRfx: NONREACTIVE

## 2018-04-24 LAB — RPR: RPR Ser Ql: NONREACTIVE

## 2018-04-24 LAB — GLUCOSE TOLERANCE, 2 HOURS W/ 1HR: GLUCOSE, FASTING: 70 mg/dL (ref 65–91)

## 2018-04-30 ENCOUNTER — Encounter: Payer: Self-pay | Admitting: Medical

## 2018-04-30 ENCOUNTER — Other Ambulatory Visit: Payer: Self-pay

## 2018-04-30 ENCOUNTER — Ambulatory Visit (INDEPENDENT_AMBULATORY_CARE_PROVIDER_SITE_OTHER): Payer: Self-pay | Admitting: Student

## 2018-04-30 DIAGNOSIS — Z23 Encounter for immunization: Secondary | ICD-10-CM

## 2018-04-30 DIAGNOSIS — Z3480 Encounter for supervision of other normal pregnancy, unspecified trimester: Secondary | ICD-10-CM

## 2018-04-30 DIAGNOSIS — Z3A29 29 weeks gestation of pregnancy: Secondary | ICD-10-CM

## 2018-04-30 DIAGNOSIS — Z3483 Encounter for supervision of other normal pregnancy, third trimester: Secondary | ICD-10-CM

## 2018-04-30 NOTE — Progress Notes (Signed)
   PRENATAL VISIT NOTE  Subjective:  Tina Anthony is a 28 y.o. G2P1001 at [redacted]w[redacted]d being seen today for ongoing prenatal care.  She is currently monitored for the following issues for this low-risk pregnancy and has Supervision of other normal pregnancy, antepartum; History of low birth weight infant; and Language barrier on their problem list.  Patient reports resolution in her nausea/loss of appetite and vaginal discharge. She has no complaints today. Clarifed that her dating was by 17 week [place]. Marland Kitchen  Contractions: Not present. Vag. Bleeding: None.  Movement: Present. Denies leaking of fluid.   The following portions of the patient's history were reviewed and updated as appropriate: allergies, current medications, past family history, past medical history, past social history, past surgical history and problem list. Problem list updated.  Objective:   Vitals:   04/30/18 0845  BP: 111/67  Pulse: 66  Weight: 133 lb 1.6 oz (60.4 kg)    Fetal Status: Fetal Heart Rate (bpm): 159 Fundal Height: 29 cm Movement: Present     General:  Alert, oriented and cooperative. Patient is in no acute distress.  Skin: Skin is warm and dry. No rash noted.   Cardiovascular: Normal heart rate noted  Respiratory: Normal respiratory effort, no problems with respiration noted  Abdomen: Soft, gravid, appropriate for gestational age.  Pain/Pressure: Absent     Pelvic: Cervical exam deferred        Extremities: Normal range of motion.  Edema: Trace  Mental Status: Normal mood and affect. Normal behavior. Normal judgment and thought content.   Assessment and Plan:  Pregnancy: G2P1001 at [redacted]w[redacted]d  1. Supervision of other normal pregnancy, antepartum -reviewed CBC and CMP, normal. She is taking her prenatal vitamins.  - Glucose Tolerance, 2 Hours w/1 Hour - Tdap vaccine greater than or equal to 7yo IM  Preterm labor symptoms and general obstetric precautions including but not limited to vaginal  bleeding, contractions, leaking of fluid and fetal movement were reviewed in detail with the patient. Please refer to After Visit Summary for other counseling recommendations.  Return in about 2 weeks (around 05/14/2018), or LROB.  No future appointments.  Marylene Land, CNM

## 2018-04-30 NOTE — Patient Instructions (Signed)
Planificacin familiar natural Natural Family Planning  La planificacin familiar natural es un tipo de mtodo anticonceptivo en el que no se [place] ningn medicamento ni dispositivo anticonceptivo. Se basa en saber en qu das del mes los ovarios de la mujer producen un vulo (ovulacin). La ovulacin es el momento del ciclo menstrual en el que la mujer es ms frtil y, por ende, tiene ms posibilidades de [place]. A fin de disminuirlas, se evitan las relaciones sexuales durante la ovulacin. El mtodo de planificacin familiar natural es seguro y puede evitar embarazos si se [place] como corresponde. Sin embargo, no ofrece proteccin contra las enfermedades de transmisin sexual. Tambin se puede usar como mtodo para quedar [place], si se decide Child psychotherapist sexuales durante la ovulacin. Cmo funciona el mtodo de planificacin familiar natural? Con este mtodo, la pareja sexual sabe cmo funciona el cuerpo de la mujer durante el ciclo menstrual.  En general, la mujer tiene un perodo menstrual cada 28a30das. Sin embargo, puede [employer] 23 y 35das entre cada uno de estos perodos. Esto vara de Janne Napoleon a otra. Una mujer cuyo ciclo menstrual es de 28das tiene aproximadamente 6das al [employer] las probabilidades de quedar embarazada son [place].  La ovulacin ocurre de [id number] antes del inicio del prximo perodo menstrual. Existen diferentes mtodos que se usan para determinar cundo comienza la ovulacin.  Un vulo es frtil durante 24horas despus de salir del ovario. Los espermatozoides pueden vivir durante 3das o ms. Qu mtodos de planificacin familiar naturales se pueden usar para Building control surveyor? Mtodo de temperatura basal Durante la ovulacin, a menudo se produce un pequeo aumento de Therapist, nutritional. Para usar este mtodo:  Tmese la temperatura todas las maanas antes de levantarse de la cama. Anote la temperatura en una  cartilla.  No tenga relaciones sexuales desde el inicio del perodo menstrual hasta los 3das posteriores al aumento de la temperatura. Tenga en cuenta que la [employer] corporal puede aumentar como resultado de diversos factores, por ejemplo, [place], dormir sin descansar y las rutinas laborales. Mtodo del examen del moco cervical Justo antes de la ovulacin, el moco de la parte baja del tero (cuello uterino) pasa de ser seco y pegajoso a hmedo y Comptroller. Para usar este mtodo:  Controle el moco todos los [address]  [address] para ver si se producen [employer]. La ovulacin se produce el ltimo da de moco hmedo [employer].  No tenga relaciones sexuales apenas note el moco hmedo y resbaloso hasta 4das despus de que desaparece o vuelve a su consistencia normal.  Con este mtodo, es seguro tener [employer] siguientes momentos: ? Despus de que han pasado 4das y Elaina Hoops 10das despus del inicio del perodo menstrual. ? Los [employer] que el moco es seco. Tenga en cuenta que el moco cervical puede aumentar o cambiar su consistencia debido a otros motivos aparte de la ovulacin, por ejemplo, infecciones, lubricantes, algunos medicamentos y excitacin sexual. Entre las variaciones del mtodo del examen del moco cervical, se incluyen el mtodo de [address], el mtodo [place] y Investment banker, corporate. Mtodo sintotrmico Este mtodo Clinical biochemist mtodo del examen del moco cervical y el de la temperatura basal. Mtodo calendario Este mtodo implica controlar los ciclos menstruales para determinar cundo ocurre la ovulacin. Resulta til cuando los ciclos menstruales varan en su duracin. Para usar este mtodo:  Durante , registre el inicio y la finalizacin de los perodos menstruales y la duracin de cada ciclo menstrual. La duracin  del ciclo menstrual es desde el da 1 del perodo menstrual presente hasta el da 1 del prximo ciclo menstrual.  Use esta informacin para  determinar cundo es probable que ovule. No tenga relaciones sexuales en ese momento. Quizs necesite ayuda del mdico para determinar en qu das tiene ms probabilidades de [place]edar embarazada. ? En general, la ovulacin ocurre de 12a14das antes del inicio del prximo perodo menstrual. ? El sangrado vaginal leve (manchado) o los clicos abdominales a la mitad del ciclo menstrual pueden ser signos de ovulacin. Sin embargo, no todas las mujeres tienen estos sntomas. Mtodo de Bed Bath & Beyondlos das estndar Este mtodo se basa en estudios de los niveles de hormonas de la mujer durante los ciclos menstruales normales. Segn estos estudios, se desarroll una regla estndar para predecir cundo es ms frtil la mujer durante el ciclo menstrual. De acuerdo con la regla, si el ciclo es de [place]entre 26y32das, la mujer es ms frtil [place]entre los [id number]. Para evitar un embarazo, no se deben The Northwestern Mutualtener relaciones sexuales en este momento, o bien, se debe usar un mtodo anticonceptivo de Warehouse managerbarrera. Este mtodo funciona mejor si el ciclo dura regularmente entre [id number]. Cundo no se debe usar un mtodo de planificacin familiar natural? Usted no debe usar un mtodo de planificacin familiar natural en los siguientes casos:  Tiene perodos menstruales muy irregulares o que a veces no se inician.  Observa una hemorragia vaginal anormal.  Tuvo un beb hace poco tiempo o est en perodo de lactancia.  Tiene una infeccin en la vagina o en el cuello uterino.  Toma medicamentos que Midwifepueden afectar el moco vaginal o la Arts development officertemperatura corporal. Entre ellos, se incluyen los antibiticos, los medicamentos para la glndula tiroidea y los antihistamnicos (que se encuentran en algunos medicamentos para el resfro y Fish farm managerla alergia).  Usted no desea quedar embarazada en este momento. Existen otros mtodos anticonceptivos que son ms eficaces que los mtodos de planificacin familiar naturales para Building control surveyorevitar embarazos.  Le preocupan las  enfermedades de transmisin sexual. Resumen  Los mtodos de planificacin familiar naturales ayudan a las mujeres y a sus parejas a comprender cmo Location managerevitar el embarazo sin usar medicamentos u otros mtodos.  Una mujer aprende a Verizonreconocer los das en los que es ms frtil. Una mujer cuyo ciclo menstrual es de 28das tiene aproximadamente 6das al United Stationersmes en los que puede quedar [place]embarazada. Esta informacin no tiene Theme park managercomo fin reemplazar el consejo del mdico. Asegrese de hacerle al mdico cualquier pregunta que tenga. Document Released: 11/27/2012 Document Revised: 12/25/2016 Document Reviewed: 12/25/2016 Elsevier Interactive Patient Education  2019 [employer]

## 2018-05-01 LAB — GLUCOSE TOLERANCE, 2 HOURS W/ 1HR
GLUCOSE, FASTING: 75 mg/dL (ref 65–91)
Glucose, 1 hour: 86 mg/dL (ref 65–179)
Glucose, 2 hour: 77 mg/dL (ref 65–152)

## 2018-05-14 ENCOUNTER — Encounter: Payer: Self-pay | Admitting: Student

## 2018-05-14 ENCOUNTER — Ambulatory Visit (INDEPENDENT_AMBULATORY_CARE_PROVIDER_SITE_OTHER): Payer: Self-pay | Admitting: Student

## 2018-05-14 VITALS — BP 105/59 | HR 69 | Wt 132.0 lb

## 2018-05-14 DIAGNOSIS — Z3483 Encounter for supervision of other normal pregnancy, third trimester: Secondary | ICD-10-CM

## 2018-05-14 DIAGNOSIS — Z87898 Personal history of other specified conditions: Secondary | ICD-10-CM

## 2018-05-14 DIAGNOSIS — Z3480 Encounter for supervision of other normal pregnancy, unspecified trimester: Secondary | ICD-10-CM

## 2018-05-14 NOTE — Progress Notes (Addendum)
   PRENATAL VISIT NOTE  Subjective:  Tina Anthony is a 28 y.o. G2P1001 at [redacted]w[redacted]d being seen today for ongoing prenatal care.  She is currently monitored for the following issues for this low-risk pregnancy and has Supervision of other normal pregnancy, antepartum; History of low birth weight infant; and Language barrier on their problem list.  Patient reports no complaints.  Contractions: Not present. Vag. Bleeding: None.  Movement: Present. Denies leaking of fluid.   The following portions of the patient's history were reviewed and updated as appropriate: allergies, current medications, past family history, past medical history, past social history, past surgical history and problem list. Problem list updated.  Objective:   Vitals:   05/14/18 1640  BP: (!) 105/59  Pulse: 69  Weight: 132 lb (59.9 kg)    Fetal Status: Fetal Heart Rate (bpm): 134 Fundal Height: 30 cm Movement: Present     General:  Alert, oriented and cooperative. Patient is in no acute distress.  Skin: Skin is warm and dry. No rash noted.   Cardiovascular: Normal heart rate noted  Respiratory: Normal respiratory effort, no problems with respiration noted  Abdomen: Soft, gravid, appropriate for gestational age.  Pain/Pressure: Absent     Pelvic: Cervical exam deferred        Extremities: Normal range of motion.  Edema: None  Mental Status: Normal mood and affect. Normal behavior. Normal judgment and thought content.   Assessment and Plan:  Pregnancy: G2P1001 at [redacted]w[redacted]d  1. History of low birth weight infant -Reviewed delivery report from 02/2011; baby was born at 57 and 4 with EFW of 5 lbs 9 oz; technically not IUGR. Will order growth [place] now for baseline and continue to monitor FH.   - [place] MFM OB FOLLOW UP; Future -Patient still not sure about bc; we discussed options. She will think about it nexplanon vs. IUD.   Preterm labor symptoms and general obstetric precautions including but not limited to  vaginal bleeding, contractions, leaking of fluid and fetal movement were reviewed in detail with the patient. Please refer to After Visit Summary for other counseling recommendations.  Return in about 2 weeks (around 05/28/2018), or LROB.  Future Appointments  Date Time Provider Department Center  05/21/2018  1:45 PM [facility] [place] 2 [facility]-M[facility]US MFC-US    Marylene Land, CNM

## 2018-05-14 NOTE — Patient Instructions (Signed)
Informacin sobre el dispositivo intrauterino  Intrauterine Device Information  El dispositivo intrauterino (DIU) es un dispositivo mdico que se coloca en el tero para evitar el embarazo. Es un dispositivo pequeo en forma de "T" del que cuelgan uno o dos hilos de nailon. Los hilos cuelgan de la parte inferior del tero (cuello uterino) para que el DIU pueda extraerse en el futuro. Hay dos tipos de DIU:   DIU hormonal. Este tipo de DIU est hecho de plstico y contiene la hormona progestina (progesterona sinttica). Un DIU hormonal puede durar entre 3 y 5aos.   DIU de cobre. Este tipo de DIU est envuelto por un alambre de cobre. Un DIU de cobre puede durar hasta 10aos.  Cmo se coloca un DIU?  El DIU se introduce a travs de la vagina y se coloca en el tero con un procedimiento mdico menor. El procedimiento exacto de colocacin del DIU puede variar segn el mdico y el hospital.  Cmo funciona el DIU?  La progesterona sinttica del DIU hormonal evita el embarazo de la siguiente manera:   Hace que el moco cervical se haga ms espeso a fin de evitar que los espermatozoides ingresen al tero.   Adelgaza el endometrio para evitar que el vulo fecundado se implante all.  El cobre del DIU de cobre hace que el tero y las trompas de Falopio produzcan un lquido que destruye los espermatozoides, lo que evita el embarazo.  Cules son las ventajas del DIU?  Ventaja de los dos tipos de DIU   Es muy efectivo para prevenir el embarazo.   Es reversible. La mujer puede quedar embarazada poco tiempo despus de extraer el DIU.   Tiene poco mantenimiento y puede dejarse colocado durante un largo perodo.   No hay efectos secundarios relacionados con el estrgeno.   Se puede utilizar durante la lactancia.   No est asociado con el aumento de peso.   Puede colocarse inmediatamente despus de un parto, un aborto provocado o un aborto espontneo.  Ventajas del DIU hormonal   Si se coloca antes de que transcurran  7das del inicio del perodo menstrual, funciona de inmediato despus de la insercin. Si el DIU hormonal se coloca en cualquier otro momento del ciclo, ser necesario que utilice un mtodo anticonceptivo adicional durante los 7das posteriores a la colocacin.   Puede hacer que los perodos menstruales sean ms livianos.   Puede reducir los clicos menstruales.   Se puede utilizar durante un perodo de 3a 5aos.  Ventajas del DIU de cobre   Funciona de inmediato despus de la colocacin.   Puede utilizarse como un mtodo anticonceptivo de emergencia si se coloca antes de que pasen 5das despus de haber tenido relaciones sexuales sin proteccin.   No afecta a las hormonas naturales del cuerpo.   Se puede utilizar durante 10aos.  Cules son las desventajas del DIU?   El DIU puede causar sangrado menstrual irregular durante un tiempo despus de su colocacin.   Puede sentir dolor durante la colocacin y tener clicos y sangrado vaginal posteriormente.   El DIU puede cortar el tero (perforacin uterina) al colocarlo. Esto es poco frecuente.   El DIU puede causar enfermedad plvica inflamatoria (EPI), que es una infeccin del tero y de las trompas de Falopio. Esto es poco frecuente; si ocurre, suele producirse durante los primeros 20das despus de colocar el DIU.   El DIU de cobre puede hacer que el flujo menstrual sea ms abundante y doloroso.  Cmo se extrae el   DIU?   Estar acostada boca arriba, con las rodillas flexionadas y los pies en los soportes (estribos).   Se introducir un dispositivo en la vagina para separar las paredes vaginales (espculo). Esto permitir que el mdico vea los hilos del DIU.   El mdico usar un pequeo instrumento (frceps) para agarrar los hilos del DIU y tirar firmemente hasta extraerlo.  Puede sentir algunas molestias durante la extraccin del DIU. El mdico puede recomendarle que tome analgsicos de venta libre, como ibuprofeno, antes del procedimiento.  Tambin puede tener una pequea cantidad de sangrado durante algunos das despus del procedimiento.  El procedimiento exacto de extraccin del DIU puede variar segn el mdico y el hospital.  Es el DIU una opcin adecuada para m?  El mdico se asegurar de que el uso de un DIU sea adecuado para usted y analizar con usted las ventajas, las desventajas y los posibles efectos secundarios.  Resumen   El dispositivo intrauterino (DIU) es un dispositivo mdico que se coloca en el tero para evitar el embarazo. Es un dispositivo pequeo en forma de "T" del que cuelgan uno o dos hilos de nailon.   El DIU hormonal contiene la hormona progestina (progesterona sinttica). El DIU de cobre tiene un alambre de cobre enrollado alrededor.   La progesterona sinttica del DIU hormonal evita el embarazo al espesar el moco cervical y adelgazar las paredes del tero. El cobre del DIU de cobre hace que el tero y las trompas de Falopio produzcan un lquido que destruye los espermatozoides, lo que evita el embarazo.   El DIU hormonal puede dejarse colocado de 3a 5aos. El DIU de cobre puede dejarse colocado hasta 10aos.   El mdico es quien coloca y extrae el DIU. Puede sentir un poco de dolor durante la colocacin y la extraccin. El mdico puede recomendarle que tome analgsicos de venta libre, como ibuprofeno, antes del procedimiento del DIU.  Esta informacin no tiene como fin reemplazar el consejo del mdico. Asegrese de hacerle al mdico cualquier pregunta que tenga.  Document Released: 09/14/2009 Document Revised: 12/28/2016 Document Reviewed: 12/28/2016  Elsevier Interactive Patient Education  2019 Elsevier Inc.

## 2018-05-21 ENCOUNTER — Ambulatory Visit (HOSPITAL_COMMUNITY)
Admission: RE | Admit: 2018-05-21 | Discharge: 2018-05-21 | Disposition: A | Discharge status: Home / Self Care | Payer: Self-pay | Source: Ambulatory Visit | Attending: Student | Admitting: Student

## 2018-05-21 ENCOUNTER — Encounter (HOSPITAL_COMMUNITY): Payer: Self-pay

## 2018-05-21 DIAGNOSIS — Z87898 Personal history of other specified conditions: Secondary | ICD-10-CM | POA: Insufficient documentation

## 2018-05-21 DIAGNOSIS — O350XX0 Maternal care for (suspected) central nervous system malformation in fetus, not applicable or unspecified: Secondary | ICD-10-CM

## 2018-05-21 DIAGNOSIS — Z362 Encounter for other antenatal screening follow-up: Secondary | ICD-10-CM

## 2018-05-21 DIAGNOSIS — Z3A32 32 weeks gestation of pregnancy: Secondary | ICD-10-CM

## 2018-05-21 DIAGNOSIS — Z3480 Encounter for supervision of other normal pregnancy, unspecified trimester: Secondary | ICD-10-CM | POA: Insufficient documentation

## 2018-05-21 DIAGNOSIS — O09293 Supervision of pregnancy with other poor reproductive or obstetric history, third trimester: Secondary | ICD-10-CM

## 2018-05-22 ENCOUNTER — Other Ambulatory Visit (HOSPITAL_COMMUNITY): Payer: Self-pay | Admitting: *Deleted

## 2018-05-22 DIAGNOSIS — Z362 Encounter for other antenatal screening follow-up: Secondary | ICD-10-CM

## 2018-05-28 ENCOUNTER — Ambulatory Visit (INDEPENDENT_AMBULATORY_CARE_PROVIDER_SITE_OTHER): Payer: Self-pay | Admitting: Student

## 2018-05-28 DIAGNOSIS — Z3480 Encounter for supervision of other normal pregnancy, unspecified trimester: Secondary | ICD-10-CM

## 2018-05-28 DIAGNOSIS — Z3483 Encounter for supervision of other normal pregnancy, third trimester: Secondary | ICD-10-CM

## 2018-05-28 DIAGNOSIS — Z3A33 33 weeks gestation of pregnancy: Secondary | ICD-10-CM

## 2018-05-28 NOTE — Progress Notes (Signed)
   PRENATAL VISIT NOTE  Subjective:  Tina Anthony is a 28 y.o. G2P1001 at [redacted]w[redacted]d being seen today for ongoing prenatal care.  She is currently monitored for the following issues for this low-risk pregnancy and has Supervision of other normal pregnancy, antepartum; History of low birth weight infant; and Language barrier on their problem list.  Patient reports no complaints.  Contractions: Not present. Vag. Bleeding: None.  Movement: Present. Denies leaking of fluid.   The following portions of the patient's history were reviewed and updated as appropriate: allergies, current medications, past family history, past medical history, past social history, past surgical history and problem list. Problem list updated.  Objective:   Vitals:   05/28/18 1629  BP: 109/71  Pulse: 65  Weight: 136 lb (61.7 kg)    Fetal Status: Fetal Heart Rate (bpm): 138   Movement: Present     General:  Alert, oriented and cooperative. Patient is in no acute distress.  Skin: Skin is warm and dry. No rash noted.   Cardiovascular: Normal heart rate noted  Respiratory: Normal respiratory effort, no problems with respiration noted  Abdomen: Soft, gravid, appropriate for gestational age.  Pain/Pressure: Absent     Pelvic: Cervical exam deferred        Extremities: Normal range of motion.  Edema: None  Mental Status: Normal mood and affect. Normal behavior. Normal judgment and thought content.   Assessment and Plan:  Pregnancy: G2P1001 at [redacted]w[redacted]d  1. Supervision of normal pregnancy -Reviewed [place] results; patient has growth and anatomy scan in 4 weeks.  -Patient still does not want birth control right now as she is solo.   Preterm labor symptoms and general obstetric precautions including but not limited to vaginal bleeding, contractions, leaking of fluid and fetal movement were reviewed in detail with the patient. Please refer to After Visit Summary for other counseling recommendations.  No  follow-ups on file.  Future Appointments  Date Time Provider Department Center  06/11/2018  4:15 PM Madlyn Frankel [facility] WOC  06/18/2018  3:00 PM WH-MFC [place] 3 WH-MFCUS MFC-US  06/18/2018  4:15 PM Marylene Land, CNM WOC-WOCA WOC  06/25/2018  4:15 PM Crisoforo Oxford, Charlesetta Garibaldi, CNM WOC-[facility]A WOC  07/02/2018  4:15 PM Crisoforo Oxford, Charlesetta Garibaldi, CNM WOC-WOCA WOC  07/09/2018  4:15 PM Crisoforo Oxford, Charlesetta Garibaldi, CNM [facility]OC-[facility] WOC  07/16/2018  4:15 PM Crisoforo Oxford, Charlesetta Garibaldi, CNM WOC-[facility] WOC    Marylene Land, [place]

## 2018-06-11 ENCOUNTER — Ambulatory Visit (INDEPENDENT_AMBULATORY_CARE_PROVIDER_SITE_OTHER): Payer: Self-pay | Admitting: Student

## 2018-06-11 VITALS — BP 118/74 | HR 75 | Wt 137.8 lb

## 2018-06-11 DIAGNOSIS — Z3480 Encounter for supervision of other normal pregnancy, unspecified trimester: Secondary | ICD-10-CM

## 2018-06-11 DIAGNOSIS — N898 Other specified noninflammatory disorders of vagina: Secondary | ICD-10-CM

## 2018-06-11 DIAGNOSIS — Z3A35 35 weeks gestation of pregnancy: Secondary | ICD-10-CM

## 2018-06-11 DIAGNOSIS — O26893 Other specified pregnancy related conditions, third trimester: Secondary | ICD-10-CM

## 2018-06-11 DIAGNOSIS — Z113 Encounter for screening for infections with a predominantly sexual mode of transmission: Secondary | ICD-10-CM

## 2018-06-11 NOTE — Patient Instructions (Signed)
Tercer trimestre de embarazo Third Trimester of Pregnancy  El tercer trimestre comprende desde la semana28 hasta la semana40 (desde el mes7 hasta el mes9). En este trimestre, el beb en gestacin (feto) crece muy rpidamente. Hacia el final del noveno mes, el beb en gestacin mide alrededor de 20pulgadas (45cm) de largo. Pesa entre 6y 10libras (2,70y 4,50kg). Siga estas indicaciones en su casa: Medicamentos  Tome los medicamentos de venta libre y los recetados solamente como se lo haya indicado el mdico. Algunos medicamentos son seguros para tomar durante el embarazo y otros no lo son.  Tome vitaminas prenatales que contengan por lo menos 600microgramos (?g) de cido flico.  Si tiene dificultad para mover el intestino (estreimiento), tome un medicamento para ablandar las heces (laxante) si su mdico se lo autoriza. Comida y bebida   Ingiera alimentos saludables de manera regular.  No coma carne cruda ni quesos sin cocinar.  Si obtiene poca cantidad de calcio de los alimentos que ingiere, consulte a su mdico sobre la posibilidad de tomar un suplemento diario de calcio.  La ingesta diaria de cuatro o cinco comidas pequeas en lugar de tres comidas abundantes.  Evite el consumo de alimentos ricos en grasas y azcares, como los alimentos fritos y los dulces.  Para evitar el estreimiento: ? Consuma alimentos ricos en fibra, como frutas y verduras frescas, cereales integrales y frijoles. ? Beba suficiente lquido para mantener el pis (orina) claro o de color amarillo plido. Actividad  Haga ejercicios solamente como se lo haya indicado el mdico. Interrumpa la actividad fsica si comienza a tener calambres.  No levante objetos pesados, use zapatos de tacones bajos y sintese derecha.  No haga ejercicio si hace demasiado calor, hay demasiada humedad o se encuentra en un lugar de mucha altura (altitud alta).  Puede continuar teniendo relaciones sexuales, a menos que el  mdico le indique lo contrario. Alivio del dolor y del malestar  Use un sostn que le brinde buen soporte si sus mamas estn sensibles.  Haga pausas frecuentes y descanse con las piernas levantadas si tiene calambres en las piernas o dolor en la zona lumbar.  Dese baos de asiento con agua tibia para aliviar el dolor o las molestias causadas por las hemorroides. Use una crema para las hemorroides si el mdico la autoriza.  Si desarrolla venas hinchadas y abultadas (vrices) en las piernas: ? Use medias de compresin o medias de descanso como se lo haya indicado el mdico. ? Levante (eleve) los pies durante 15minutos, 3 o 4veces por da. ? Limite el consumo de sal en sus alimentos. Seguridad  Colquese el cinturn de seguridad cuando conduzca.  Haga una lista de los nmeros de telfono de emergencia, que incluya los nmeros de telfono de familiares, amigos, el hospital, as como los departamentos de polica y bomberos. Preparacin para la llegada del beb Para prepararse para la llegada de su beb:  Tome clases prenatales.  Practique ir manejando al hospital.  Visite el hospital y recorra el rea de maternidad.  Hable en su trabajo acerca de tomar licencia cuando llegue el beb.  Prepare el bolso que llevar al hospital.  Prepare la habitacin del beb.  Concurra a los controles mdicos.  Compre un asiento de seguridad orientado hacia atrs para llevar al beb en el automvil. Aprenda cmo instalarlo en el auto. Instrucciones generales  No se d baos de inmersin en agua caliente, baos turcos ni saunas.  No consuma ningn producto que contenga nicotina o tabaco, como cigarrillos y cigarrillos   electrnicos. Si necesita ayuda para dejar de fumar, consulte al mdico.  No beba alcohol.  No se haga duchas vaginales ni use tampones o toallas higinicas perfumadas.  No mantenga las piernas cruzadas durante mucho tiempo.  No haga viajes de larga distancia, excepto si es  obligatorio. Hgalos solamente si su mdico la autoriza.  Visite a su dentista si no lo ha hecho durante el embarazo. Use un cepillo de cerdas suaves para cepillarse los dientes. Psese el hilo dental con suavidad.  Evite el contacto con las bandejas sanitarias de los gatos y la tierra que estos animales usan. Estos elementos contienen bacterias que pueden causar defectos congnitos al beb y la posible prdida del beb (aborto espontneo) o la muerte fetal.  Concurra a todas las visitas prenatales como se lo haya indicado el mdico. Esto es importante. Comunquese con un mdico si:  No est segura de si est en trabajo de parto o si ha roto la bolsa de las aguas.  Tiene mareos.  Tiene clicos leves o siente presin en la parte baja del vientre.  Sufre un dolor persistente en el abdomen.  Sigue teniendo malestar estomacal, vomita o tiene heces lquidas.  Advierte un lquido con olor ftido que proviene de la vagina.  Siente dolor al orinar. Solicite ayuda de inmediato si:  Tiene fiebre.  Tiene una prdida de lquido por la vagina.  Tiene sangrado o pequeas prdidas vaginales.  Siente dolor intenso o clicos en el abdomen.  Aumenta o baja de peso rpidamente.  Tiene dificultades para recuperar el aliento y siente dolor en el pecho.  Sbitamente se le hinchan mucho el rostro, las manos, los tobillos, los pies o las piernas.  No ha sentido los movimientos del beb durante una hora.  Siente un dolor de cabeza intenso que no se alivia con medicamentos.  Tiene dificultad para ver.  Tiene prdida de lquido o le sale un chorro de lquido de la vagina antes de estar en la semana 37.  Tiene espasmos abdominales (contracciones) regulares antes de estar en la semana 37. Resumen  El tercer trimestre comprende desde la semana28 hasta la semana40 (desde el mes7 hasta el mes9). Esta es la poca en que el beb en gestacin crece muy rpidamente.  Siga los consejos del mdico  con respecto a los medicamentos, la alimentacin y la actividad.  Preprese para la llegada del beb tomando las clases prenatales, preparando todo lo que necesitar el beb, arreglando la habitacin del beb y concurriendo a los controles mdicos.  Solicite ayuda de inmediato si tiene sangrado por la vagina, siente dolor en el pecho o tiene dificultad para respirar, o si no ha sentido que su beb se mueve en el transcurso de ms de una hora. Esta informacin no tiene como fin reemplazar el consejo del mdico. Asegrese de hacerle al mdico cualquier pregunta que tenga. Document Released: 11/27/2012 Document Revised: 10/30/2016 Document Reviewed: 10/30/2016 Elsevier Interactive Patient Education  2019 Elsevier Inc.  

## 2018-06-12 NOTE — Progress Notes (Signed)
   PRENATAL VISIT NOTE  Subjective:  Tina Anthony is a 28 y.o. G2P1001 at [redacted]w[redacted]d being seen today for ongoing prenatal care.  She is currently monitored for the following issues for this low-risk pregnancy and has Supervision of other normal pregnancy, antepartum; History of low birth weight infant; and Language barrier on their problem list.  Patient reports vaginal irritation with some itching.  Contractions: Not present. Vag. Bleeding: None.  Movement: Present. Denies leaking of fluid.   The following portions of the patient's history were reviewed and updated as appropriate: allergies, current medications, past family history, past medical history, past social history, past surgical history and problem list. Problem list updated.  Objective:   Vitals:   06/11/18 1629  BP: 118/74  Pulse: 75  Weight: 137 lb 12.8 oz (62.5 kg)    Fetal Status: Fetal Heart Rate (bpm): 135 Fundal Height: 34 cm Movement: Present     General:  Alert, oriented and cooperative. Patient is in no acute distress.  Skin: Skin is warm and dry. No rash noted.   Cardiovascular: Normal heart rate noted  Respiratory: Normal respiratory effort, no problems with respiration noted  Abdomen: Soft, gravid, appropriate for gestational age.  Pain/Pressure: Absent     Pelvic: Cervical exam deferred        Extremities: Normal range of motion.  Edema: None  Mental Status: Normal mood and affect. Normal behavior. Normal judgment and thought content.   Assessment and Plan:  Pregnancy: G2P1001 at [redacted]w[redacted]d  1. Vaginal discharge  - WET PREP FOR TRICH, YEAST, CLUE  2. Supervision of other normal pregnancy, antepartum -doing well, FH measures appropriately today. Will have [place] next week for growth.   Preterm labor symptoms and general obstetric precautions including but not limited to vaginal bleeding, contractions, leaking of fluid and fetal movement were reviewed in detail with the patient. Please refer to  After Visit Summary for other counseling recommendations.  Return in about 1 week (around 06/18/2018), or LROB.  Future Appointments  Date Time Provider Department Center  06/18/2018  2:40 PM WH-MFC NURSE WH-MFC MFC-US  06/18/2018  3:00 PM WH-MFC [place] 3 WH-MFC[facility] MFC-US  06/18/2018  4:15 PM Marylene Land, CNM WOC-WOCA WOC  06/25/2018  4:15 PM Crisoforo Oxford, Charlesetta Garibaldi, CNM WOC-WOCA WOC  07/02/2018  4:15 PM Crisoforo Oxford, Charlesetta Garibaldi, CNM WOC-WOCA WOC  07/09/2018  4:15 PM Crisoforo Oxford, Charlesetta Garibaldi, CNM WOC-[facility]A WOC  07/16/2018  4:15 PM Crisoforo Oxford, Charlesetta Garibaldi, CNM WOC-[facility] WOC    Marylene Land, [place]

## 2018-06-14 LAB — CERVICOVAGINAL ANCILLARY ONLY
Bacterial vaginitis: NEGATIVE
Candida vaginitis: NEGATIVE
TRICH (WINDOWPATH): NEGATIVE

## 2018-06-18 ENCOUNTER — Encounter (HOSPITAL_COMMUNITY): Payer: Self-pay

## 2018-06-18 ENCOUNTER — Ambulatory Visit (INDEPENDENT_AMBULATORY_CARE_PROVIDER_SITE_OTHER): Payer: Self-pay | Admitting: Student

## 2018-06-18 ENCOUNTER — Ambulatory Visit (HOSPITAL_COMMUNITY)
Admission: RE | Admit: 2018-06-18 | Discharge: 2018-06-18 | Disposition: A | Discharge status: Home / Self Care | Payer: Self-pay | Source: Ambulatory Visit | Attending: Student | Admitting: Student

## 2018-06-18 ENCOUNTER — Other Ambulatory Visit: Payer: Self-pay

## 2018-06-18 ENCOUNTER — Ambulatory Visit (HOSPITAL_COMMUNITY): Payer: Self-pay | Admitting: *Deleted

## 2018-06-18 VITALS — BP 119/73 | HR 90 | Wt 138.2 lb

## 2018-06-18 VITALS — BP 119/73 | HR 90 | Wt 138.0 lb

## 2018-06-18 DIAGNOSIS — Z87898 Personal history of other specified conditions: Secondary | ICD-10-CM

## 2018-06-18 DIAGNOSIS — O350XX0 Maternal care for (suspected) central nervous system malformation in fetus, not applicable or unspecified: Secondary | ICD-10-CM | POA: Insufficient documentation

## 2018-06-18 DIAGNOSIS — Z3A36 36 weeks gestation of pregnancy: Secondary | ICD-10-CM

## 2018-06-18 DIAGNOSIS — O09293 Supervision of pregnancy with other poor reproductive or obstetric history, third trimester: Secondary | ICD-10-CM

## 2018-06-18 DIAGNOSIS — IMO0002 Reserved for concepts with insufficient information to code with codable children: Secondary | ICD-10-CM

## 2018-06-18 DIAGNOSIS — Z3480 Encounter for supervision of other normal pregnancy, unspecified trimester: Secondary | ICD-10-CM

## 2018-06-18 DIAGNOSIS — Z362 Encounter for other antenatal screening follow-up: Secondary | ICD-10-CM | POA: Insufficient documentation

## 2018-06-18 DIAGNOSIS — Z3483 Encounter for supervision of other normal pregnancy, third trimester: Secondary | ICD-10-CM

## 2018-06-18 DIAGNOSIS — O3506X0 Maternal care for (suspected) central nervous system malformation or damage in fetus, hydrocephaly, not applicable or unspecified: Secondary | ICD-10-CM

## 2018-06-18 DIAGNOSIS — Z113 Encounter for screening for infections with a predominantly sexual mode of transmission: Secondary | ICD-10-CM

## 2018-06-18 NOTE — Progress Notes (Signed)
   PRENATAL VISIT NOTE  Subjective:  Josseline Joyce Copa is a 28 y.o. G2P1001 at [redacted]w[redacted]d being seen today for ongoing prenatal care.  She is currently monitored for the following issues for this low-risk pregnancy and has Supervision of other normal pregnancy, antepartum; History of low birth weight infant; and Language barrier on their problem list.  Patient reports no complaints.  Contractions: Not present. Vag. Bleeding: None.  Movement: Present. Denies leaking of fluid.   The following portions of the patient's history were reviewed and updated as appropriate: allergies, current medications, past family history, past medical history, past social history, past surgical history and problem list.   Objective:   Vitals:   06/18/18 1623  BP: 119/73  Pulse: 90  Weight: 138 lb 3.2 oz (62.7 kg)    Fetal Status: Fetal Heart Rate (bpm): 151 Fundal Height: 34 cm Movement: Present  Presentation: Vertex  General:  Alert, oriented and cooperative. Patient is in no acute distress.  Skin: Skin is warm and dry. No rash noted.   Cardiovascular: Normal heart rate noted  Respiratory: Normal respiratory effort, no problems with respiration noted  Abdomen: Soft, gravid, appropriate for gestational age.  Pain/Pressure: Present     Pelvic: Cervical exam deferred Dilation: Closed      Extremities: Normal range of motion.  Edema: None  Mental Status: Normal mood and affect. Normal behavior. Normal judgment and thought content.   Assessment and Plan:  Pregnancy: G2P1001 at [redacted]w[redacted]d 1. Supervision of other normal pregnancy, antepartum -FH measuring small, but fetal growth today appropriate by [place] and no fup needed per MFM.   - GC/Chlamydia probe amp ([place])not at [facility] - Culture, beta strep (group b only)  Preterm labor symptoms and general obstetric precautions including but not limited to vaginal bleeding, contractions, leaking of fluid and fetal movement were reviewed in detail with the  patient. Please refer to After Visit Summary for other counseling recommendations.   No follow-ups on file.  Future Appointments  Date Time Provider Department Center  06/25/2018  4:15 PM Madlyn Frankel [facility] WOC  07/02/2018  4:15 PM Crisoforo Oxford, Charlesetta Garibaldi, CNM WOC-WOCA WOC  07/09/2018  4:15 PM Crisoforo Oxford Charlesetta Garibaldi, CNM WOC-WOCA WOC  07/16/2018  4:15 PM Crisoforo Oxford, Charlesetta Garibaldi, CNM WOC-WOCA WOC    Marylene Land, [place]

## 2018-06-20 LAB — GC/CHLAMYDIA PROBE AMP (~~LOC~~) NOT AT ARMC
Chlamydia: NEGATIVE
Neisseria Gonorrhea: NEGATIVE

## 2018-06-21 LAB — CULTURE, BETA STREP (GROUP B ONLY): Strep Gp B Culture: NEGATIVE

## 2018-06-24 ENCOUNTER — Telehealth: Payer: Self-pay | Admitting: Family Medicine

## 2018-06-24 NOTE — Telephone Encounter (Signed)
Called patient with spanish interpreter ID# [phone] to inform about the restrictions at the office due to the coronavirus. Patient verbalized understanding.

## 2018-06-25 ENCOUNTER — Ambulatory Visit (INDEPENDENT_AMBULATORY_CARE_PROVIDER_SITE_OTHER): Payer: Self-pay | Admitting: Student

## 2018-06-25 ENCOUNTER — Other Ambulatory Visit: Payer: Self-pay

## 2018-06-25 VITALS — BP 131/75 | HR 87 | Wt 140.9 lb

## 2018-06-25 DIAGNOSIS — Z3480 Encounter for supervision of other normal pregnancy, unspecified trimester: Secondary | ICD-10-CM

## 2018-06-25 DIAGNOSIS — Z3483 Encounter for supervision of other normal pregnancy, third trimester: Secondary | ICD-10-CM

## 2018-06-25 DIAGNOSIS — Z3A37 37 weeks gestation of pregnancy: Secondary | ICD-10-CM

## 2018-06-25 NOTE — Patient Instructions (Signed)
Evaluación de los movimientos fetales °Fetal Movement Counts °Introducción °Nombre del paciente: ________________________________________________ Fecha de parto estimada: ____________________ °¿Qué es una evaluación de los movimientos fetales? ° °Una evaluación de los movimientos fetales es el registro del número de veces que siente que el bebé se mueve durante un cierto período de tiempo. Esto también se puede denominar recuento de patadas fetales. Una evaluación de movimientos fetales se recomienda a todas las embarazadas. Es posible que le indiquen que comience a evaluar los movimientos fetales desde la semana 28 de embarazo. °Preste atención cuando sienta que el bebé está más activo. Podrá detectar los ciclos en que el bebé duerme y está despierto. También podrá detectar que ciertas cosas hacen que su bebé se mueva más. Deberá realizar una evaluación de los movimientos fetales en las siguientes situaciones: °· Cuando el bebé está más activo habitualmente. °· A la misma hora, todos los días. °Un buen momento para evaluar los movimientos fetales es cuando está descansando, después de haber comido y bebido algo. °¿Cómo debo contar los movimientos fetales? °1. Encuentre un lugar tranquilo y cómodo. Siéntese o acuéstese de lado. °2. Anote la fecha, la hora de inicio y de finalización y la cantidad de movimientos que sintió entre esas dos horas. Lleve esta información a las visitas de control. °3. Cuente las pataditas, revoloteos, chasquidos, vueltas o pinchazos en un período de 2 horas. Debe sentir al menos 10 movimientos en 2 horas. °4. Cuando sienta 10 movimientos, puede dejar de contar. °5. Si no siente 10 movimientos en 2 horas, coma y beba algo. Luego, continúe descansando y contando durante 1 hora. Si siente al menos 4 movimientos durante esa hora, puede dejar de contar. °Comuníquese con un médico si: °· Siente menos de 4 movimientos en 2 horas. °· El bebé no se mueve tanto como suele hacerlo. °Fecha:  ____________ Hora de inicio: ____________ Hora de finalización: ____________ Movimientos: ____________ °Fecha: ____________ Hora de inicio: ____________ Hora de finalización: ____________ Movimientos: ____________ °Fecha: ____________ Hora de inicio: ____________ Hora de finalización: ____________ Movimientos: ____________ °Fecha: ____________ Hora de inicio: ____________ Hora de finalización: ____________ Movimientos: ____________ °Fecha: ____________ Hora de inicio: ____________ Hora de finalización: ____________ Movimientos: ____________ °Fecha: ____________ Hora de inicio: ____________ Hora de finalización: ____________ Movimientos: ____________ °Fecha: ____________ Hora de inicio: ____________ Hora de finalización: ____________ Movimientos: ____________ °Fecha: ____________ Hora de inicio: ____________ Hora de finalización: ____________ Movimientos: ____________ °Fecha: ____________ Hora de inicio: ____________ Hora de finalización: ____________ Movimientos: ____________ °Esta información no tiene como fin reemplazar el consejo del médico. Asegúrese de hacerle al médico cualquier pregunta que tenga. °Document Released: 07/04/2007 Document Revised: 06/30/2016 Document Reviewed: 05/06/2015 °Elsevier Interactive Patient Education © 2019 Elsevier Inc. ° °

## 2018-06-25 NOTE — Progress Notes (Signed)
   PRENATAL VISIT NOTE  Subjective:  Tina Anthony is a 28 y.o. G2P1001 at [redacted]w[redacted]d being seen today for ongoing prenatal care.  She is currently monitored for the following issues for this low-risk pregnancy and has Supervision of other normal pregnancy, antepartum; History of low birth weight infant; and Language barrier on their problem list.  Patient reports no complaints.  Contractions: Not present. Vag. Bleeding: None.  Movement: Present. Denies leaking of fluid.   The following portions of the patient's history were reviewed and updated as appropriate: allergies, current medications, past family history, past medical history, past social history, past surgical history and problem list.   Objective:   Vitals:   06/25/18 1637  BP: 131/75  Pulse: 87  Weight: 140 lb 14.4 oz (63.9 kg)    Fetal Status: Fetal Heart Rate (bpm): 144 Fundal Height: 34 cm Movement: Present     General:  Alert, oriented and cooperative. Patient is in no acute distress.  Skin: Skin is warm and dry. No rash noted.   Cardiovascular: Normal heart rate noted  Respiratory: Normal respiratory effort, no problems with respiration noted  Abdomen: Soft, gravid, appropriate for gestational age.  Pain/Pressure: Present     Pelvic: Cervical exam deferred        Extremities: Normal range of motion.  Edema: None  Mental Status: Normal mood and affect. Normal behavior. Normal judgment and thought content.   Assessment and Plan:  Pregnancy: G2P1001 at [redacted]w[redacted]d 1. Supervision of other normal pregnancy, antepartum   -doing well; discussed visitor restrictions -Reviewed warning signs; not Baby RX candidate bc I would like to continue measuring FH weekly.  -Growth scan last week reassuring; 6 lb 4 oz, low concern for IUGR at this point.   Preterm labor symptoms and general obstetric precautions including but not limited to vaginal bleeding, contractions, leaking of fluid and fetal movement were reviewed in  detail with the patient. Please refer to After Visit Summary for other counseling recommendations.   No follow-ups on file.  Future Appointments  Date Time Provider Department Center  07/02/2018  4:15 PM Madlyn Frankel [facility]  07/09/2018  4:15 PM Crisoforo Oxford, Charlesetta Garibaldi, CNM [facility]  07/16/2018  4:15 PM Crisoforo Oxford, Charlesetta Garibaldi, CNM [facility]    Marylene Land, [place]

## 2018-07-02 ENCOUNTER — Ambulatory Visit (INDEPENDENT_AMBULATORY_CARE_PROVIDER_SITE_OTHER): Payer: Self-pay | Admitting: Student

## 2018-07-02 ENCOUNTER — Other Ambulatory Visit: Payer: Self-pay

## 2018-07-02 VITALS — BP 135/79 | HR 76 | Wt 141.2 lb

## 2018-07-02 DIAGNOSIS — Z3480 Encounter for supervision of other normal pregnancy, unspecified trimester: Secondary | ICD-10-CM

## 2018-07-02 DIAGNOSIS — Z3A38 38 weeks gestation of pregnancy: Secondary | ICD-10-CM

## 2018-07-02 DIAGNOSIS — Z3483 Encounter for supervision of other normal pregnancy, third trimester: Secondary | ICD-10-CM

## 2018-07-02 NOTE — Progress Notes (Signed)
   PRENATAL VISIT NOTE  Subjective:  Tina Anthony is a 28 y.o. G2P1001 at [redacted]w[redacted]d being seen today for ongoing prenatal care.  She is currently monitored for the following issues for this low-risk pregnancy and has Supervision of other normal pregnancy, antepartum; History of low birth weight infant; and Language barrier on their problem list.  Patient reports no complaints.  Contractions: Not present. Vag. Bleeding: None.  Movement: Present. Denies leaking of fluid.   The following portions of the patient's history were reviewed and updated as appropriate: allergies, current medications, past family history, past medical history, past social history, past surgical history and problem list.   Objective:   Vitals:   07/02/18 1621  BP: 135/79  Pulse: 76  Weight: 141 lb 3.2 oz (64 kg)    Fetal Status: Fetal Heart Rate (bpm): 140 Fundal Height: 36 cm Movement: Present     General:  Alert, oriented and cooperative. Patient is in no acute distress.  Skin: Skin is warm and dry. No rash noted.   Cardiovascular: Normal heart rate noted  Respiratory: Normal respiratory effort, no problems with respiration noted  Abdomen: Soft, gravid, appropriate for gestational age.  Pain/Pressure: Present     Pelvic: Cervical exam deferred        Extremities: Normal range of motion.  Edema: Trace  Mental Status: Normal mood and affect. Normal behavior. Normal judgment and thought content.   Assessment and Plan:  Pregnancy: G2P1001 at [redacted]w[redacted]d 1. Supervision of other normal pregnancy, antepartum   -Not babyRX appropriate bc patient has no email and is not confident using email.  -Return next week for LROB, and then in two weeks for LROB and NST/BPP between 40-41 weeks.  -Will schedule induction at 39 week visit.    Term labor symptoms and general obstetric precautions including but not limited to vaginal bleeding, contractions, leaking of fluid and fetal movement were reviewed in detail with  the patient. Please refer to After Visit Summary for other counseling recommendations.      Return in about 1 week (around 07/09/2018), or LROB.  Future Appointments  Date Time Provider Department Center  07/09/2018  4:15 PM Madlyn Frankel [facility]  07/16/2018  4:15 PM Crisoforo Oxford, Charlesetta Garibaldi, CNM [facility]    Marylene Land, [place]

## 2018-07-02 NOTE — Patient Instructions (Signed)
Induccin del trabajo de parto  Labor Induction    Se denomina induccin del trabajo de parto cuando se inician acciones para hacer que una mujer embarazada comience el trabajo de parto. La mayora de las mujeres comienzan el trabajo de parto de forma natural entre las semanas 37 y 42 del embarazo. Cuando esto no ocurre o cuando por necesidad mdica se debe iniciarlo por otros medios, se podran utilizar diferentes mtodos. La induccin del trabajo de parto hace que el tero se contraiga. Tambin hace que el cuello uterino se ablande (madure), se abra (se dilate), y se afine (se borre). Generalmente, el trabajo de parto no se induce antes de las 39semanas, excepto que haya un motivo mdico para hacerlo. El mdico determinar si se debe inducir el parto.  Antes de inducir el trabajo de parto, el mdico considerar ciertos factores; entre ellos:   Su cuadro clnico y el del beb.   En cul semana del embarazo se encuentra.   La madurez de los pulmones del beb.   El estado del cuello uterino.   La posicin del beb.   El tamao del canal del parto.  Cules son algunos de los motivos para inducir un parto?  Se podra inducir un parto en los siguientes casos:   Su salud o la salud del beb estn en riesgo.   El embarazo se pas de trmino 1semana o ms.   Rompi la bolsa, pero no se inici el trabajo de parto de forma natural.   La cantidad de lquido amnitico que rodea al beb es poca.  Tambin podra optar por (elegir) que le induzcan el trabajo de parto en un determinado momento. Por lo general, la induccin del trabajo de parto por eleccin no se hace antes de las 39semanas del embarazo.  Qu mtodos se usan para inducir el trabajo de parto?  Los mtodos utilizados para inducir el trabajo de parto incluyen los siguientes:   Administracin de prostaglandina. Este medicamento hace que se inicien las contracciones y que el cuello uterino se dilate y aliste. Puede tomarse por boca (de forma oral) o  introducirse en la vagina (supositorio).   Insercin de un pequeo tubo delgado (catter) que tiene un baln en el extremo en la vagina; luego, expansin del baln con agua para dilatar el cuello uterino.   Ruptura de las membranas. En este mtodo, el mdico separa, con cuidado, el tejido del saco amnitico del cuello uterino. En consecuencia, el cuello uterino se expande, lo que, a la vez, provoca la liberacin de una hormona llamada progesterona. Esta hormona hace que el tero se contraiga. Este procedimiento suele realizarse en el consultorio del mdico; luego, la enviarn a su hogar para esperar a que comiencen las contracciones.   Romper la bolsa de las aguas. En este mtodo, el mdico usa un pequeo instrumento para hacer un pequeo orificio en el saco amnitico. Al cabo de un tiempo, esto har que el saco amnitico se rompa. Las contracciones deberan comenzar algunas horas despus.   Medicamentos que desencadenen o intensifiquen las contracciones. Estos se administran a travs de una va intravenosa (IV) que se coloca en una vena del brazo.  Excepto la ruptura de las membranas, que puede realizarse en una clnica, la induccin del trabajo de parto se realiza en el hospital para que puedan controlarlos con atencin a usted y al beb.  Cunto tiempo lleva inducir el trabajo de parto?  La duracin del proceso de induccin depende de la preparacin del cuerpo para el   trabajo de parto. Algunas inducciones pueden durar hasta 2 o 3das, mientras que otras podran durar menos de un da. La induccin podra durar ms en los siguientes casos:   La induccin se hace en una etapa temprana del embarazo.   Es el primer embarazo de la madre.   El cuello uterino no est listo.  Cules son algunos de los riesgos asociados con la induccin del trabajo de parto?  Algunos de los riesgos asociados con la induccin del trabajo de parto son los siguientes:   Cambios en la frecuencia cardaca fetal, por ejemplo, los  latidos son demasiado rpidos o lentos, o son irregulares (errticos).   Fracaso de la induccin.   Infeccin en la madre o el beb.   Aumento de la posibilidad de que sea necesaria una cesrea.   Muerte fetal.   Ruptura (desprendimiento) de la placenta del tero (raro).   Ruptura del tero (muy poco frecuente).  Cuando es necesario realizar una induccin por motivos mdicos, los beneficios suelen superar los riesgos.  Cules son algunos de los motivos para no inducir el trabajo de parto?  La induccin no debe realizarse si:   El beb no tolera las contracciones.   Se someti anteriormente a cirugas en el tero, como una miomectoma, le extirparon fibromas o tiene una cicatriz vertical de un parto por cesrea anterior.   La placenta est en una posicin muy baja en el tero y obstruye la abertura del cuello uterino (placenta previa).   El beb no est ubicado con la cabeza hacia bajo.   El cordn umbilical cae hacia el canal del parto, adelante del beb.   Hay circunstancias poco habituales, como que se encuentre en una etapa muy temprana del embarazo (beb prematuro).   Tuvo ms de 2partos por cesrea anteriormente.  Resumen   Se denomina induccin del trabajo de parto cuando se inician acciones para hacer que una mujer embarazada comience el trabajo de parto.   La induccin del trabajo de parto hace que el tero se contraiga. Tambin hace que el cuello uterino se aliste, se dilate y se borre.   El trabajo de parto no se induce antes de las 39semanas de gestacin, excepto que haya un motivo mdico para hacerlo.   Cuando es necesario realizar una induccin por motivos mdicos, los beneficios suelen superar los riesgos.  Esta informacin no tiene como fin reemplazar el consejo del mdico. Asegrese de hacerle al mdico cualquier pregunta que tenga.  Document Released: 07/04/2007 Document Revised: 12/05/2016 Document Reviewed: 12/05/2016  Elsevier Interactive Patient Education  2019 Elsevier Inc[employer]

## 2018-07-04 ENCOUNTER — Telehealth: Payer: Self-pay | Admitting: Obstetrics and Gynecology

## 2018-07-04 ENCOUNTER — Telehealth: Payer: Self-pay | Admitting: Student

## 2018-07-04 NOTE — Telephone Encounter (Signed)
Attempted to call patient with Spanish interpreter to inform her on the time change on her 4/7 appointment. No answer, lvm with the new time of the appointment.

## 2018-07-04 NOTE — Telephone Encounter (Signed)
Returned call to patient. Interpreter left a VM about her appointment change.

## 2018-07-04 NOTE — Telephone Encounter (Signed)
The patient called in with questions of why her appointment was cancelled. Informed the patient she needed an NST/BPP and she is scheduled for the earliest available appointment. She also had questions of if she can come to the appointment without it being an issue due to the restrictions on travel. 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 Attending the appointment is absolutely fine.

## 2018-07-08 ENCOUNTER — Encounter (HOSPITAL_COMMUNITY): Payer: Self-pay | Admitting: *Deleted

## 2018-07-08 ENCOUNTER — Inpatient Hospital Stay (HOSPITAL_COMMUNITY)
Admission: AD | Admit: 2018-07-08 | Discharge: 2018-07-10 | DRG: 807 | Disposition: A | Discharge status: Home / Self Care | Payer: Medicaid Other | Attending: Family Medicine | Admitting: Family Medicine

## 2018-07-08 DIAGNOSIS — Z3A39 39 weeks gestation of pregnancy: Secondary | ICD-10-CM | POA: Diagnosis not present

## 2018-07-08 DIAGNOSIS — Z3480 Encounter for supervision of other normal pregnancy, unspecified trimester: Secondary | ICD-10-CM

## 2018-07-08 DIAGNOSIS — O26893 Other specified pregnancy related conditions, third trimester: Secondary | ICD-10-CM | POA: Diagnosis present

## 2018-07-08 LAB — CBC
HCT: 38.6 % (ref 36.0–46.0)
Hemoglobin: 13.5 g/dL (ref 12.0–15.0)
MCH: 31.8 pg (ref 26.0–34.0)
MCHC: 35 g/dL (ref 30.0–36.0)
MCV: 91 fL (ref 80.0–100.0)
Platelets: 237 10*3/uL (ref 150–400)
RBC: 4.24 MIL/uL (ref 3.87–5.11)
RDW: 12.5 % (ref 11.5–15.5)
WBC: 13.8 10*3/uL — ABNORMAL HIGH (ref 4.0–10.5)
nRBC: 0 % (ref 0.0–0.2)

## 2018-07-08 LAB — TYPE AND SCREEN
ABO/RH(D): O POS
Antibody Screen: NEGATIVE

## 2018-07-08 MED ORDER — SOD CITRATE-CITRIC ACID 500-334 MG/5ML PO SOLN: 30.0000 mL | ORAL | Status: DC | PRN

## 2018-07-08 MED ORDER — SIMETHICONE 80 MG PO CHEW: 80.0000 mg | CHEWABLE_TABLET | ORAL | Status: DC | PRN

## 2018-07-08 MED ORDER — BENZOCAINE-MENTHOL 20-0.5 % EX AERO
1.0000 "application " | INHALATION_SPRAY | CUTANEOUS | Status: DC | PRN
  Administered 2018-07-08: 1 via TOPICAL
  Filled 2018-07-08: qty 56

## 2018-07-08 MED ORDER — OXYTOCIN 40 UNITS IN NORMAL SALINE INFUSION - SIMPLE MED
2.5000 [IU]/h | INTRAVENOUS | Status: DC
  Filled 2018-07-08: qty 1000

## 2018-07-08 MED ORDER — COCONUT OIL OIL: 1.0000 "application " | TOPICAL_OIL | Status: DC | PRN

## 2018-07-08 MED ORDER — OXYTOCIN 40 UNITS IN NORMAL SALINE INFUSION - SIMPLE MED
INTRAVENOUS | Status: AC
  Filled 2018-07-08: qty 1000

## 2018-07-08 MED ORDER — ONDANSETRON HCL 4 MG/2ML IJ SOLN: 4.0000 mg | INTRAMUSCULAR | Status: DC | PRN

## 2018-07-08 MED ORDER — LACTATED RINGERS IV SOLN: 500.0000 mL | INTRAVENOUS | Status: DC | PRN

## 2018-07-08 MED ORDER — DIBUCAINE 1 % RE OINT: 1.0000 "application " | TOPICAL_OINTMENT | RECTAL | Status: DC | PRN

## 2018-07-08 MED ORDER — SENNOSIDES-DOCUSATE SODIUM 8.6-50 MG PO TABS
2.0000 | ORAL_TABLET | ORAL | Status: DC
  Administered 2018-07-09 (×2): 2 via ORAL
  Filled 2018-07-08 (×2): qty 2

## 2018-07-08 MED ORDER — OXYCODONE-ACETAMINOPHEN 5-325 MG PO TABS: 2.0000 | ORAL_TABLET | ORAL | Status: DC | PRN

## 2018-07-08 MED ORDER — ONDANSETRON HCL 4 MG PO TABS: 4.0000 mg | ORAL_TABLET | ORAL | Status: DC | PRN

## 2018-07-08 MED ORDER — OXYCODONE-ACETAMINOPHEN 5-325 MG PO TABS: 1.0000 | ORAL_TABLET | ORAL | Status: DC | PRN

## 2018-07-08 MED ORDER — ACETAMINOPHEN 325 MG PO TABS: 650.0000 mg | ORAL_TABLET | ORAL | Status: DC | PRN

## 2018-07-08 MED ORDER — IBUPROFEN 600 MG PO TABS
600.0000 mg | ORAL_TABLET | Freq: Four times a day (QID) | ORAL | Status: DC
  Administered 2018-07-08 – 2018-07-10 (×8): 600 mg via ORAL
  Filled 2018-07-08 (×8): qty 1

## 2018-07-08 MED ORDER — TETANUS-DIPHTH-ACELL PERTUSSIS 5-2.5-18.5 LF-MCG/0.5 IM SUSP: 0.5000 mL | Freq: Once | INTRAMUSCULAR | Status: DC

## 2018-07-08 MED ORDER — DIPHENHYDRAMINE HCL 25 MG PO CAPS: 25.0000 mg | ORAL_CAPSULE | Freq: Four times a day (QID) | ORAL | Status: DC | PRN

## 2018-07-08 MED ORDER — MEASLES, MUMPS & RUBELLA VAC IJ SOLR: 0.5000 mL | Freq: Once | INTRAMUSCULAR | Status: DC

## 2018-07-08 MED ORDER — PRENATAL MULTIVITAMIN CH
1.0000 | ORAL_TABLET | Freq: Every day | ORAL | Status: DC
  Administered 2018-07-09 – 2018-07-10 (×2): 1 via ORAL
  Filled 2018-07-08 (×2): qty 1

## 2018-07-08 MED ORDER — LIDOCAINE HCL (PF) 1 % IJ SOLN
30.0000 mL | INTRAMUSCULAR | Status: DC | PRN
  Administered 2018-07-08: 30 mL via SUBCUTANEOUS
  Filled 2018-07-08: qty 30

## 2018-07-08 MED ORDER — ONDANSETRON HCL 4 MG/2ML IJ SOLN: 4.0000 mg | Freq: Four times a day (QID) | INTRAMUSCULAR | Status: DC | PRN

## 2018-07-08 MED ORDER — OXYTOCIN BOLUS FROM INFUSION
500.0000 mL | Freq: Once | INTRAVENOUS | Status: AC
  Administered 2018-07-08: 500 mL via INTRAVENOUS

## 2018-07-08 MED ORDER — FLEET ENEMA 7-19 GM/118ML RE ENEM: 1.0000 | ENEMA | RECTAL | Status: DC | PRN

## 2018-07-08 MED ORDER — ACETAMINOPHEN 325 MG PO TABS
650.0000 mg | ORAL_TABLET | ORAL | Status: DC | PRN
  Administered 2018-07-08 – 2018-07-09 (×3): 650 mg via ORAL
  Filled 2018-07-08 (×3): qty 2

## 2018-07-08 MED ORDER — WITCH HAZEL-GLYCERIN EX PADS: 1.0000 "application " | MEDICATED_PAD | CUTANEOUS | Status: DC | PRN

## 2018-07-08 MED ORDER — LACTATED RINGERS IV SOLN
INTRAVENOUS | Status: DC
  Administered 2018-07-08: 17:00:00 via INTRAVENOUS

## 2018-07-08 NOTE — Progress Notes (Signed)
I was present during the delivery with Dr Aneta Mins, by Orlan Leavens Spanish Medical Interpreter.

## 2018-07-08 NOTE — H&P (Signed)
LABOR AND DELIVERY ADMISSION HISTORY AND PHYSICAL NOTE  Tina Anthony is a 28 y.o. female G2P1001 with IUP at [redacted]w[redacted]d by 17w [place] presenting for labor.  Patient presented complete. Precipitous delivery shortly after. Reports contractions started about 5 hours prior to admission.  She reports positive fetal movement. She denies leakage of fluid or vaginal bleeding.  Prenatal History/Complications: PNC at [facility] Pregnancy complications:  - hx of SGA  Past Medical History: Past Medical History:  Diagnosis Date  . Medical history non-contributory     Past Surgical History: Past Surgical History:  Procedure Laterality Date  . NO PAST SURGERIES      Obstetrical History: OB History    Gravida  2   Para  1   Term  1   Preterm  0   AB  0   Living  1     SAB  0   TAB  0   Ectopic  0   Multiple  0   Live Births  1           Social History: Social History   Socioeconomic History  . Marital status: Single    Spouse name: Not on file  . Number of children: Not on file  . Years of education: Not on file  . Highest education level: Not on file  Occupational History  . Not on file  Social Needs  . Financial resource strain: Not on file  . Food insecurity:    Worry: Never true    Inability: Never true  . Transportation needs:    Medical: No    Non-medical: No  Tobacco Use  . Smoking status: Never Smoker  . Smokeless tobacco: Never Used  Substance and Sexual Activity  . Alcohol use: No  . Drug use: No  . Sexual activity: Not on file  Lifestyle  . Physical activity:    Days per week: Not on file    Minutes per session: Not on file  . Stress: Not on file  Relationships  . Social connections:    Talks on phone: Not on file    Gets together: Not on file    Attends religious service: Not on file    Active member of club or organization: Not on file    Attends meetings of clubs or organizations: Not on file    Relationship status: Not on file   Other Topics Concern  . Not on file  Social History Narrative  . Not on file    Family History: No family history on file.  Allergies: No Known Allergies  Medications Prior to Admission  Medication Sig Dispense Refill Last Dose  . Prenatal Vit-Fe Fumarate-FA (PRENATAL VITAMIN) 27-0.8 MG TABS Take 1 tablet by mouth daily. 60 tablet 3 Taking     Review of Systems  All systems reviewed and negative except as stated in HPI  Physical Exam Blood pressure (!) 114/57, pulse 89, temperature 98.3 F (36.8 C), temperature source Oral, resp. rate 18, weight 63 kg, last menstrual period 12/03/2017. General appearance: alert, oriented, pain with contractions  Lungs: normal respiratory effort Heart: regular rate Abdomen: soft, non-tender; gravid, FH appropriate for GA Extremities: No calf swelling or tenderness Presentation: cephalic Fetal monitoring: baseline 140s/mod var/+ acels/decels with pushing  Uterine activity: regular contractions q3-78m Dilation: 10 Effacement (%): 100 Exam by:: Carloyn Jaeger CNM  Prenatal labs: ABO, Rh: --/--/PENDING (03/30 1713) Antibody: PENDING (03/30 1713) Rubella:   RPR: Non Reactive (01/14 0000)  HBsAg: Negative (10/28 0000)  HIV: Non Reactive (01/14 0000)  GC/Chlamydia: negative GBS:   negative 2-hr GTT: 75/86/77 Genetic screening:  none Anatomy US: normal  Prenatal Transfer Tool  Maternal Diabetes: No Genetic Screening: Declined Maternal Ultrasounds/Referrals: Normal Fetal Ultrasounds or other Referrals:  None Maternal Substance Abuse:  No Significant Maternal Medications:  None Significant Maternal Lab Results: None  Results for orders placed or performed during the hospital encounter of 07/08/18 (from the past 24 hour(s))  CBC   Collection Time: 07/08/18  5:01 PM  Result Value Ref Range   WBC 13.8 (H) 4.0 - 10.5 K/uL   RBC 4.24 3.87 - 5.11 MIL/uL   Hemoglobin 13.5 12.0 - 15.0 g/dL   HCT 95.3 20.2 - 33.4 %   MCV 91.0 80.0 - 100.0 fL    MCH 31.8 26.0 - 34.0 pg   MCHC 35.0 30.0 - 36.0 g/dL   RDW 35.6 86.1 - 68.3 %   Platelets 237 150 - 400 K/uL   nRBC 0.0 0.0 - 0.2 %  Type and screen [facility]   Collection Time: 07/08/18  5:13 PM  Result Value Ref Range   ABO/RH(D) PENDING    Antibody Screen PENDING    Sample Expiration      07/11/2018 Performed at [facility], [address] [place], [place] [address]     Patient Active Problem List   Diagnosis Date Noted  . Normal labor and delivery 07/08/2018  . Language barrier 03/20/2018  . Supervision of other normal pregnancy, antepartum 02/18/2018  . History of low birth weight infant 02/18/2018    Assessment: Tina Anthony is a 28 y.o. G2P1001 at [redacted]w[redacted]d here for labor  #Labor: presented complete. AROM and delivered shortly after arrival.  #Pain: unmedicated delivery  #FWB:  cat 2 #ID:  GBS neg #MOF: breast and formula #MOC: yes, but undecided on method #Circ:  No  Tina Wahlquist,MD 07/08/2018, 6:02 PM

## 2018-07-08 NOTE — Discharge Summary (Signed)
Postpartum Discharge Summary     Patient Name: Tina Anthony DOB: [birth year] MRN: [id number]  Date of admission: 07/08/2018 Delivering Provider: [name]   Date of discharge: 07/10/2018  Admitting diagnosis: CTX Intrauterine pregnancy: [redacted]w[redacted]d     Secondary diagnosis:  Principal Problem:   Normal labor and delivery Active Problems:   Precipitous delivery  Additional problems: None     Discharge diagnosis: Term Pregnancy Delivered                                                                                                Post partum procedures: None  Augmentation: AROM  Complications: None  Hospital course:  Onset of Labor With Vaginal Delivery     28 y.o. yo G2P1001 at [redacted]w[redacted]d was admitted in Active Labor on 07/08/2018. Patient had an uncomplicated labor course as follows:  Membrane Rupture Time/Date: 4:58 PM ,07/08/2018   Intrapartum Procedures: Episiotomy: None [1]                                         Lacerations:  1st degree [2];Perineal [11]  Patient had a delivery of a Viable infant. 07/08/2018  Information for the patient's newborn:  Nareh, Younghans [[id number]]  Delivery Method: Vaginal, Spontaneous(Filed from Delivery Summary)    Pateint had an uncomplicated postpartum course.  She is ambulating, tolerating a regular diet, passing flatus, and urinating well. Patient is discharged home in stable condition on 07/10/18.   Magnesium Sulfate recieved: No BMZ received: No  Physical exam  Vitals:   07/09/18 0524 07/09/18 0937 07/09/18 1432 07/09/18 2343  BP: 113/71 112/73 126/78 112/73  Pulse: (!) 58 61 71 66  Resp: 14 18 16 16   Temp: 98.2 F (36.8 C) 97.8 F (36.6 C) 98.4 F (36.9 C) 98 F (36.7 C)  TempSrc: Oral Oral Oral Oral  SpO2: 99% 100%  99%  Weight:       General: alert, cooperative and no distress Lochia: appropriate Uterine Fundus: firm Incision: N/A DVT Evaluation: No evidence of DVT seen on physical  exam. Labs: Lab Results  Component Value Date   WBC 13.8 (H) 07/08/2018   HGB 13.5 07/08/2018   HCT 38.6 07/08/2018   MCV 91.0 07/08/2018   PLT 237 07/08/2018   CMP Latest Ref Rng & Units 04/23/2018  Glucose 65 - 99 mg/dL 70  BUN 6 - 20 mg/dL 7  Creatinine 5.88 - 3.25 mg/dL 4.98  Sodium 264 - 158 mmol/L 138  Potassium 3.5 - 5.2 mmol/L 3.9  Chloride 96 - 106 mmol/L 103  CO2 20 - 29 mmol/L 19(L)  Calcium 8.7 - 10.2 mg/dL 8.8  Total Protein 6.0 - 8.5 g/dL 6.3  Total Bilirubin 0.0 - 1.2 mg/dL <3.0  Alkaline Phos 39 - 117 IU/L 85  AST 0 - 40 IU/L 19  ALT 0 - 32 IU/L 18    Discharge instruction: per After Visit Summary and "Baby and Me Booklet".  After visit meds:  Allergies as of 07/10/2018  No Known Allergies     Medication List    TAKE these medications   ibuprofen 600 MG tablet Commonly known as:  ADVIL,MOTRIN Take 1 tablet (600 mg total) by mouth every 6 (six) hours.   Prenatal Vitamin 27-0.8 MG Tabs Take 1 tablet by mouth daily.       Diet: routine diet  Activity: Advance as tolerated. Pelvic rest for 6 weeks.   Outpatient follow up:6 weeks Follow up Appt: Future Appointments  Date Time Provider Department Center  08/06/2018  2:35 PM Marylene Land, CNM [facility]   Follow up Visit: Follow-up Information    Center for [facility] Follow up.   Specialty:  Obstetrics and Gynecology Why:  4-6 weeks for postpartum visit Contact information: [address] [place] [address] [phone]         Please schedule this patient for Postpartum visit in: 6 weeks with the following provider: Any provider  For C/S patients schedule nurse incision check in weeks 2 weeks: no  Low risk pregnancy complicated by: hx of SGA  Delivery mode: SVD  Anticipated Birth Control: other/unsure  PP Procedures needed: none  Schedule Integrated BH visit: no   Newborn Data: Live born female  Birth Weight: 6 lb 12.8 oz (3085  g) APGAR: 9, 9  Newborn Delivery   Birth date/time:  07/08/2018 17:25:00 Delivery type:  Vaginal, Spontaneous     Baby Feeding: Bottle and Breast Disposition:home with mother   07/10/2018 Sharen Counter, CNM

## 2018-07-08 NOTE — MAU Note (Signed)
Pt having ctx every 2 min, started around 11am. No LOF or bleeding. +FM.

## 2018-07-09 ENCOUNTER — Encounter (HOSPITAL_COMMUNITY): Payer: Self-pay | Admitting: *Deleted

## 2018-07-09 ENCOUNTER — Encounter: Payer: Self-pay | Admitting: Student

## 2018-07-09 ENCOUNTER — Other Ambulatory Visit: Payer: Self-pay

## 2018-07-09 LAB — RPR: RPR: NONREACTIVE

## 2018-07-09 NOTE — Lactation Note (Signed)
This note was copied from a baby's chart. Lactation Consultation Note Baby 13 hrs old. Used [employer] interpreter [phone] Aram Beecham for consult. Baby isn't BF well d/t biting nipples. Baby doesn't have suck swallow coordination. Mom is breast and formula. Mom has tried to give baby bottle, baby wouldn't suckle on bottle. W/gloved finger suck training attempted.  Mom has 29 yr old that she BF/Formula fed for 1 yr. Reviewed newborn behavior, STS, I&O, hand expression. Mom demonstrated hand expression.  Left Spanish Lactation brochure.  Patient Name: Tina Anthony [id number] Date: 07/09/2018 Reason for consult: Initial assessment   Maternal Data Has patient been taught Hand Expression?: Yes Does the patient have breastfeeding experience prior to this delivery?: Yes  Feeding Feeding Type: Breast Fed  LATCH Score Latch: Too sleepy or reluctant, no latch achieved, no sucking elicited.  Audible Swallowing: None  Type of Nipple: Everted at rest and after stimulation  Comfort (Breast/Nipple): Soft / non-tender  Hold (Positioning): No assistance needed to correctly position infant at breast.  LATCH Score: 6  Interventions Interventions: Breast feeding basics reviewed;Breast compression;Breast massage;Hand express;Position options;Support pillows;Skin to skin;Assisted with latch;Adjust position  Lactation Tools Discussed/Used WIC Program: Yes   Consult Status Consult Status: Follow-up Date: 07/09/18 Follow-up type: In-patient    Charyl Dancer 07/09/2018, 6:59 AM

## 2018-07-09 NOTE — Progress Notes (Signed)
Post Partum Day 1 Subjective: no complaints, up ad lib, voiding, tolerating PO and + flatus  Objective: Blood pressure 112/73, pulse 61, temperature 97.8 F (36.6 C), temperature source Oral, resp. rate 18, weight 63 kg, last menstrual period 12/03/2017, SpO2 100 %.  Physical Exam:  General: alert, cooperative, appears stated age and no distress Lochia: appropriate Uterine Fundus: firm Incision: NA DVT Evaluation: No evidence of DVT seen on physical exam.  Recent Labs    07/08/18 1701  HGB 13.5  HCT 38.6    Assessment/Plan: Plan for discharge tomorrow and Breastfeeding Declines inpatient depo prior to discharge   LOS: 1 day   [name] 07/09/2018, 12:13 PM

## 2018-07-10 ENCOUNTER — Encounter (HOSPITAL_COMMUNITY): Payer: Self-pay | Admitting: *Deleted

## 2018-07-10 DIAGNOSIS — Z3A39 39 weeks gestation of pregnancy: Secondary | ICD-10-CM

## 2018-07-10 MED ORDER — IBUPROFEN 600 MG PO TABS: 600.0000 mg | ORAL_TABLET | Freq: Four times a day (QID) | ORAL | 0 refills | Status: DC

## 2018-07-10 NOTE — Lactation Note (Signed)
This note was copied from a baby's chart. Lactation Consultation Note  Patient Name: Tina Anthony [id number] Date: 07/10/2018   Reason for consult: Follow-up assessment P2, 32 hour female infant. [employer] interpreter used # Rubin Payor [phone]) Mom breastfeed infant less than one hour prior to [facility] entering the room. Infant asleep in basinet. Per mom, infant has been breastfeeding 30 minutes most feedings now and she feels it is going well. Mom stated her  breast  Is starting to hurt  LC did breast assessment but did not see trauma. Mom breast was starting to harden in a few places possible beginning or early stage  of engorgement. LC gave mom hand pump " harmony" with size 27 mm flange. Mom was still pumping when  LC left room and Mom stated her  breast is starting to feel better. Mom will only pump enough  to soften breast and where she feels comfortable. Mom had pumped about 8 ml was still pumping when LC left the room. Mom will latch infant at breast for the  next feeding and then give infant back EBM. Mom knows to breastfeed according hunger cues, 8 or more times within 24 hours, Mom will call Nurse or LC if she has any questions, concerns or need assistance with latching infant to breast.   Maternal Data    Feeding Feeding Type: Breast Fed  LATCH Score Latch: Grasps breast easily, tongue down, lips flanged, rhythmical sucking.  Audible Swallowing: A few with stimulation  Type of Nipple: Everted at rest and after stimulation  Comfort (Breast/Nipple): Soft / non-tender  Hold (Positioning): Assistance needed to correctly position infant at breast and maintain latch.  LATCH Score: 8  Interventions    Lactation Tools Discussed/Used     Consult Status      Danelle Earthly 07/10/2018, 4:02 AM

## 2018-07-10 NOTE — Lactation Note (Signed)
This note was copied from a baby's chart. Lactation Consultation Note:  Mother reports that her breast are hurting. Observed and found that mothers breast are very firm . Advised mother to wake infant and breastfeed infant well.  Mother has formula on the bedside table but she reports that infant didn't take much at all.  Mother last breast fed for 10 mins. With last feeding one hour ago.   Advised mother in doing good breast massage and gave mother ice packs to use at home advising to use for 15 mins every 3-4 hours.  Discussed limited use of formula.   Mother has a hand pump. Advised that she post pump her breast if infant was unable to drain breast well.  Mother is active with WIC. Mother thought that she had to use formula to have [facility] provided.  Discussed that mother cue base feed infant and allow for cluster feeding. Feed infant 8-12 times in 24 hours . Mother receptive to all teaching. Father at the bedside for all interpretation if mother didn't understand.   Mother aware of how to contact [facility] services and wic office  Patient Name: Boy Saranya Wightman [id number] Date: 07/10/2018 Reason for consult: Follow-up assessment   Maternal Data    Feeding Feeding Type: Breast Fed  LATCH Score                   Interventions    Lactation Tools Discussed/Used     Consult Status Consult Status: Complete    Michel Bickers 07/10/2018, 11:49 AM

## 2018-07-16 ENCOUNTER — Encounter: Payer: Self-pay | Admitting: Student

## 2018-08-05 ENCOUNTER — Telehealth: Payer: Self-pay | Admitting: *Deleted

## 2018-08-05 NOTE — Telephone Encounter (Signed)
I called Tina Anthony with [employer] [phone] and explained she has a virtual visit tomorrow. I offered to assist her with finding app and downloading [employer] app. I assisted her with downloading app and explained what to expect for tomorrow. She voices understanding.

## 2018-08-06 ENCOUNTER — Other Ambulatory Visit: Payer: Self-pay

## 2018-08-06 ENCOUNTER — Ambulatory Visit (INDEPENDENT_AMBULATORY_CARE_PROVIDER_SITE_OTHER): Payer: Self-pay

## 2018-08-06 NOTE — Progress Notes (Signed)
1336:: Called pt using Tina Anthony, in person interpreter, regarding her appointment.  Pt did not pick up.  Left message advising pt that she was being contacted regarding her appointment and requesting she contact the clinic and advised her that she will be contacted again in 15-20 minutes.  I connected with  Tina Anthony on 08/06/18 at  2:35 PM EDT by telephone and verified that I am speaking with the correct person using two identifiers.   I discussed the limitations, risks, security and privacy concerns of performing an evaluation and management service by telephone and the availability of in person appointments. I also discussed with the patient that there may be a patient responsible charge related to this service. The patient expressed understanding and agreed to proceed.  Osvaldo Humanshley P Eisma, RN 08/06/2018  1:56 PM     TELEHEALTH VIRTUAL POSTPARTUM VISIT ENCOUNTER NOTE  I connected with Tina Anthony on 08/06/18 at  2:35 PM EDT by telephone at home and verified that I am speaking with the correct person using two identifiers.   I discussed the limitations, risks, security and privacy concerns of performing an evaluation and management service by telephone and the availability of in person appointments. I also discussed with the patient that there may be a patient responsible charge related to this service. The patient expressed understanding and agreed to proceed.  Appointment Date: 08/06/2018  [facility]  History of Present Illness: Tina Anthony is a 28 y.o. Hispanic G2P1001 (No LMP recorded.), seen for her postpartum appointment. Her past medical history is significant for none.   She is s/p normal spontaneous vaginal delivery on 07/08/18 at 39 weeks 1 day; she was discharged to home on PPD#2. Pregnancy complicated by hx of low birth weight infant. Baby is doing well.  Complains of nothing  Vaginal bleeding or discharge: No  Mode of  feeding infant: both Intercourse: No  Contraception: no method PP depression s/s: No .  Any bowel or bladder issues: No  Pap smear: no abnormalities (date: 09/2016)  Review of Systems: Positive for none. Her 12 point review of systems is negative or as noted in the History of Present Illness.  Patient Active Problem List   Diagnosis Date Noted  . Normal labor and delivery 07/08/2018  . Precipitous delivery 07/08/2018  . Language barrier 03/20/2018  . Supervision of other normal pregnancy, antepartum 02/18/2018  . History of low birth weight infant 02/18/2018    Medications Tina Anthony had no medications administered during this visit. Current Outpatient Medications  Medication Sig Dispense Refill  . Prenatal Vit-Fe Fumarate-FA (PRENATAL VITAMIN) 27-0.8 MG TABS Take 1 tablet by mouth daily. 60 tablet 3   No current facility-administered medications for this visit.     Allergies Patient has no known allergies.  Physical Exam:  General:  Alert, oriented and cooperative.   Mental Status: Normal mood and affect perceived. Normal judgment and thought content.  Rest of physical exam deferred due to type of encounter  PP Depression Screening:   Edinburgh Postnatal Depression Scale - 08/06/18 1358      Edinburgh Postnatal Depression Scale:  In the Past 7 Days   I have been able to laugh and see the funny side of things.  0    I have looked forward with enjoyment to things.  0    I have blamed myself unnecessarily when things went wrong.  0    I have been anxious or worried for no good reason.  0    I have felt scared or panicky for no good reason.  0    Things have been getting on top of me.  0    I have been so unhappy that I have had difficulty sleeping.  0    I have felt sad or miserable.  0    I have been so unhappy that I have been crying.  0    The thought of harming myself has occurred to me.  0    Edinburgh Postnatal Depression Scale Total  0        Assessment:Patient is a 28 y.o. G2P1001 who is 4 weeks postpartum from a normal spontaneous vaginal delivery.  She is doing well.   Plan:  1. Postpartum care and examination -Tina Anthony, spanish interpreter, used for entire visit -Patient without complaints and declines birth control   RTC 1 year for annual exam or sooner if needed  I discussed the assessment and treatment plan with the patient. The patient was provided an opportunity to ask questions and all were answered. The patient agreed with the plan and demonstrated an understanding of the instructions.   The patient was advised to call back or seek an in-person evaluation/go to the ED for any concerning postpartum symptoms.  I provided 5 minutes of non-face-to-face time during this encounter.   Rolm Bookbinder, CNM [facility] [employer], [facility]

## 2018-12-30 IMAGING — US US MFM OB DETAIL+14 WK
1 series · 13 of 28 positions shown · non-contrast
Comparison: none

[Series 1: us mfm ob detail+14 wk · 13 of 106 slices shown]
[im 4/106]
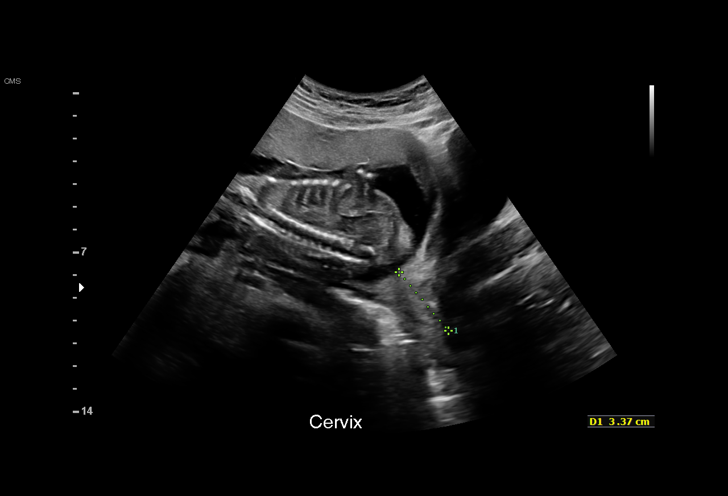
[im 12/106]
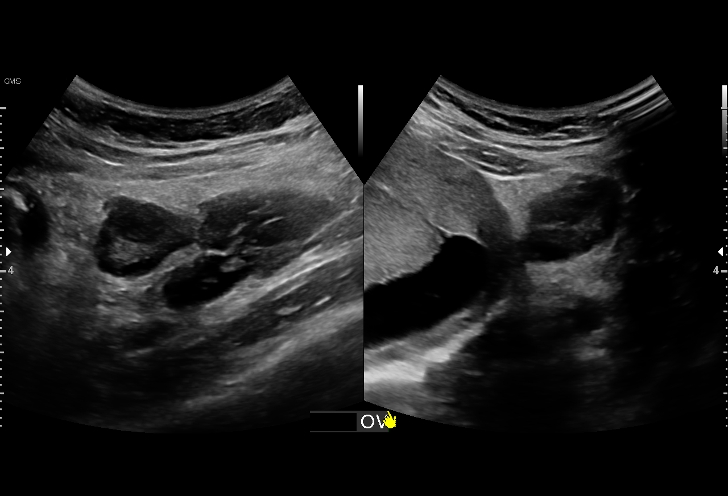
[im 20/106]
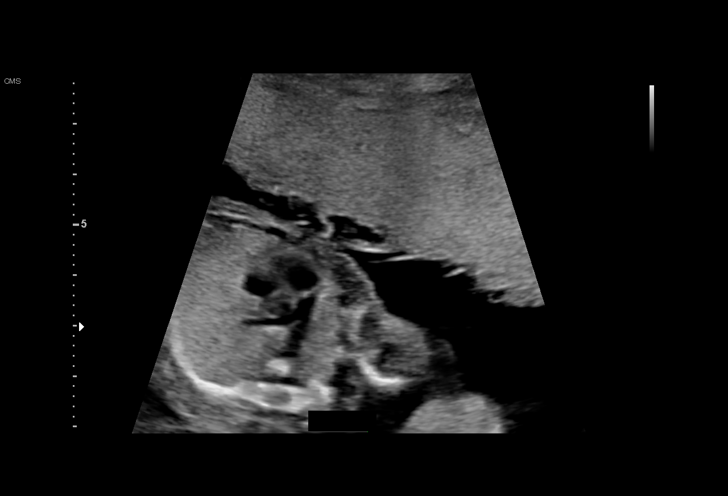
[im 28/106]
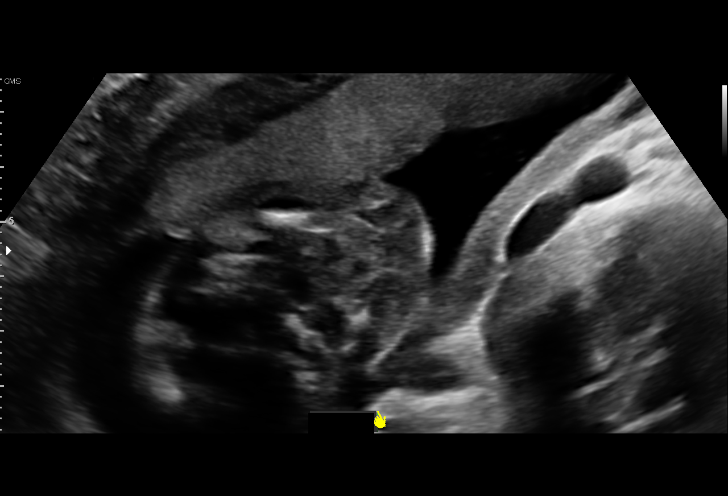
[im 36/106]
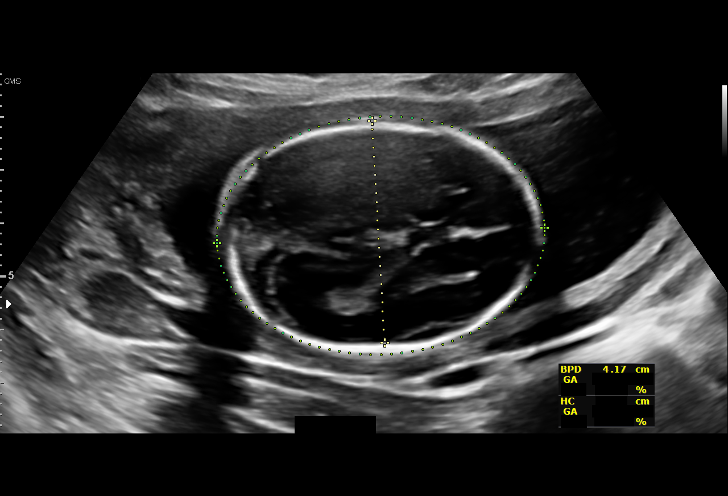
[im 43/106]
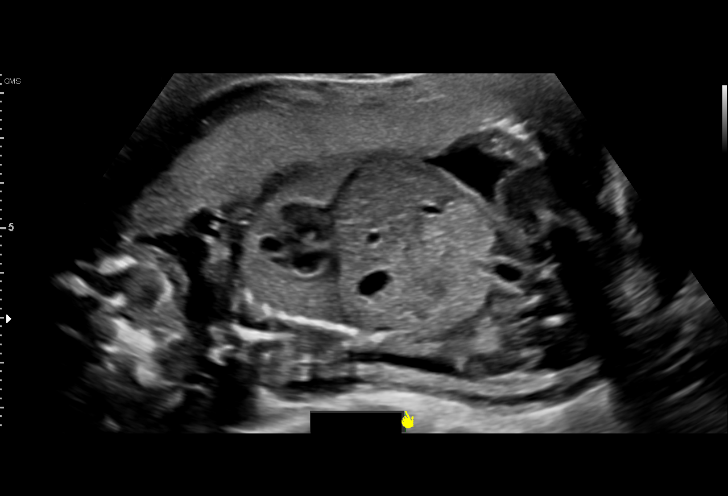
[im 55/106]
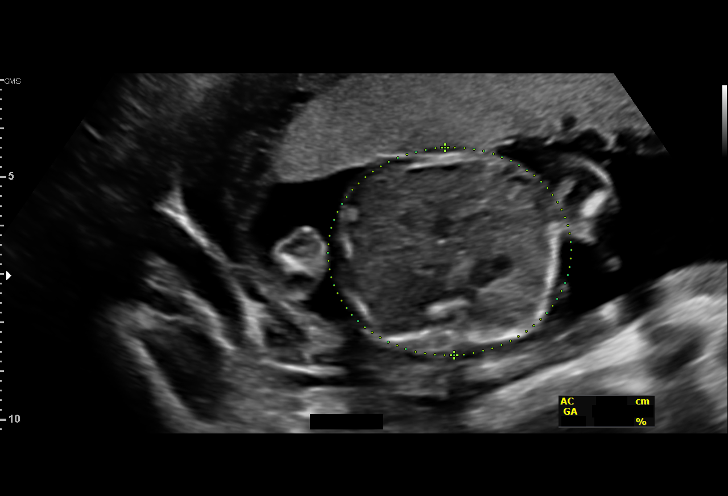
[im 63/106]
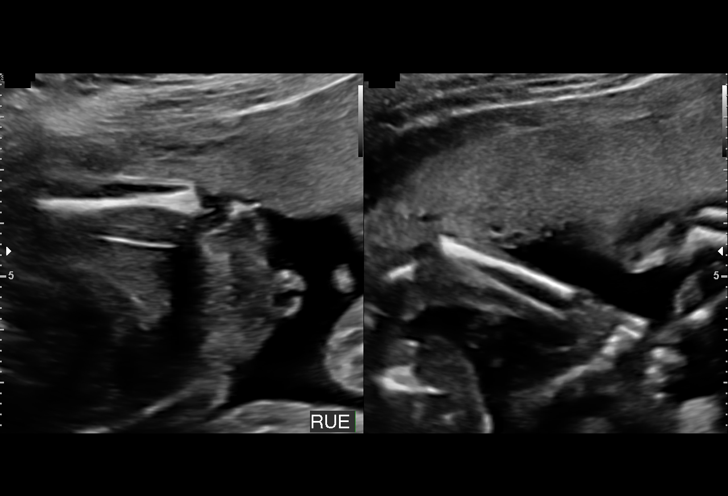
[im 71/106]
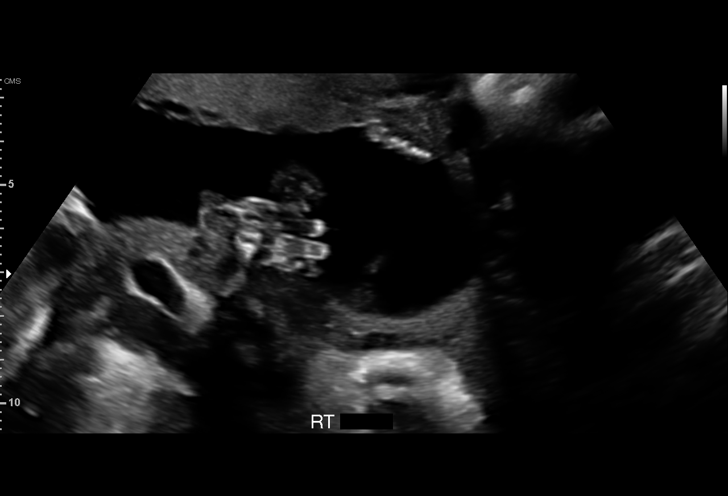
[im 78/106]
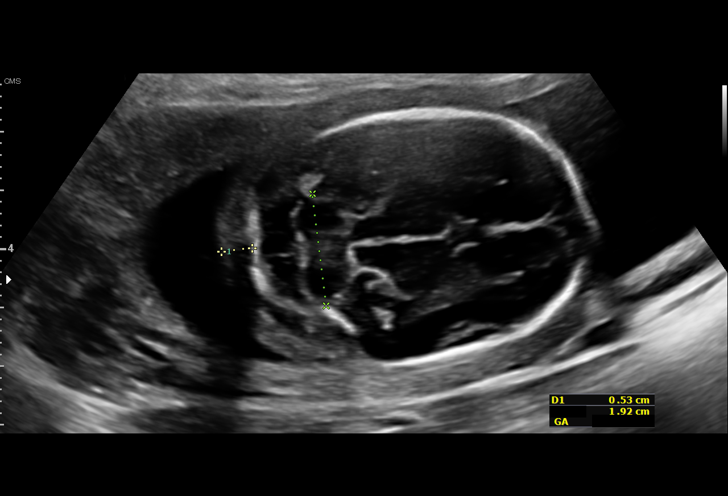
[im 86/106]
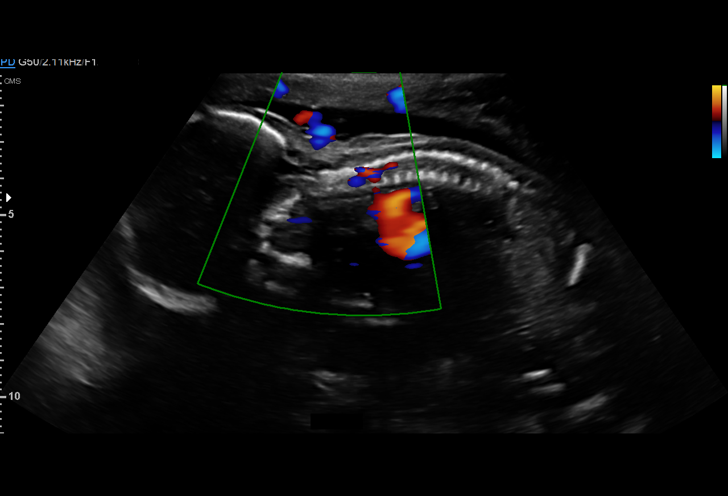
[im 94/106]
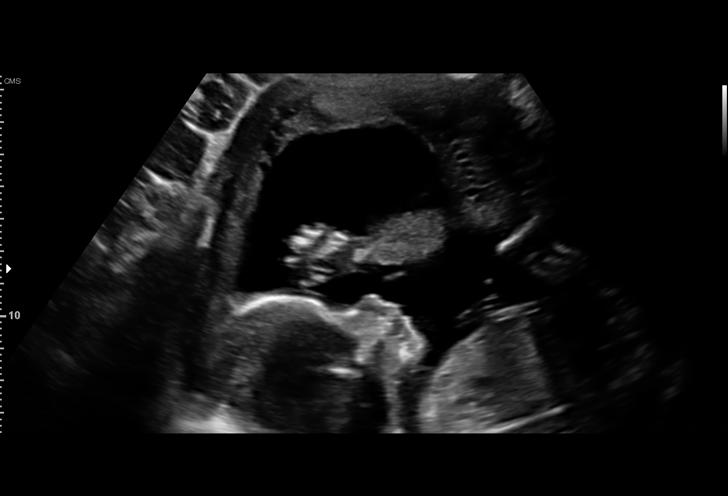
[im 102/106]
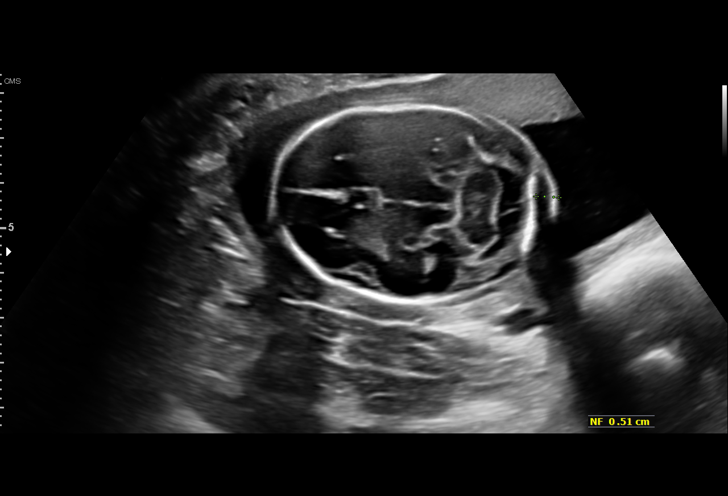

[13 of 28 positions shown; findings below may reference images not displayed]

[facility]

                                                            OB/Gyn Clinic

 ----------------------------------------------------------------------

 ----------------------------------------------------------------------
Indications

  Encounter for antenatal screening for
  malformations
  Poor obstetric history: Previous fetal growth
  restriction (FGR) - term at 2324g
  19 weeks gestation of pregnancy
 ----------------------------------------------------------------------
Fetal Evaluation

 Num Of Fetuses:         1
 Fetal Heart Rate(bpm):  142
 Cardiac Activity:       Observed
 Presentation:           Breech
 Placenta:               Anterior
 P. Cord Insertion:      Visualized

 Amniotic Fluid
 AFI FV:      Within normal limits

                             Largest Pocket(cm)

Biometry

 BPD:      41.9  mm     G. Age:  18w 5d         31  %    CI:        64.23   %    70 - 86
                                                         FL/HC:      16.8   %    16.1 -
 HC:      168.2  mm     G. Age:  19w 4d         58  %    HC/AC:      1.16        1.09 -
 AC:      145.3  mm     G. Age:  19w 6d         68  %    FL/BPD:     67.3   %
 FL:       28.2  mm     G. Age:  18w 5d         26  %    FL/AC:      19.4   %    20 - 24
 HUM:      29.3  mm     G. Age:  19w 4d         61  %
 CER:        19  mm     G. Age:  18w 4d         32  %
 NFT:       5.8  mm

 CM:        6.1  mm

 Est. FW:     284  gm    0 lb 10 oz      48  %
OB History

 Gravidity:    2         Term:   1        Prem:   0        SAB:   0
 TOP:          0       Ectopic:  0        Living: 1
Gestational Age

 LMP:           14w 6d        Date:  11/06/17                 EDD:   08/13/18
 U/S Today:     19w 2d                                        EDD:   07/13/18
 Best:          19w 1d     Det. By:  Previous Ultrasound      EDD:   07/14/18
                                     (02/04/18)
Anatomy

 Cranium:               Appears normal         LVOT:                   Appears normal
 Cavum:                 Appears normal         Aortic Arch:            Appears normal
 Ventricles:            Appears normal         Ductal Arch:            Appears normal
 Choroid Plexus:        Appears normal         Diaphragm:              Appears normal
 Cerebellum:            Appears normal         Stomach:                Appears normal, left
                                                                       sided
 Posterior Fossa:       Appears normal         Abdomen:                Appears normal
 Nuchal Fold:           Appears normal         Abdominal Wall:         Appears nml (cord
                                                                       insert, abd wall)
 Face:                  Appears normal         Cord Vessels:           Appears normal (3
                        (orbits and profile)                           vessel cord)
 Lips:                  Appears normal         Kidneys:                Appear normal
 Palate:                Not well visualized    Bladder:                Appears normal
 Thoracic:              Appears normal         Spine:                  Appears normal
 Heart:                 Appears normal         Upper Extremities:      Appears normal
                        (4CH, axis, and
                        situs)
 RVOT:                  Appears normal         Lower Extremities:      Appears normal

 Other:  Heels and 5th digit visualized. Nasal bone visualized.
Cervix Uterus Adnexa

 Cervix
 Length:            3.4  cm.
 Normal appearance by transabdominal scan.

 Uterus
 No abnormality visualized.

 Left Ovary
 Within normal limits.

 Right Ovary
 Not visualized.

 Cul De Sac
 No free fluid seen.
 Adnexa
 No abnormality visualized.
Impression

 I counseled the patient with help of Spanish Language
 Interpreter.
 We performed fetal anatomy scan. No makers of
 aneuploidies or fetal structural defects are seen. Fetal
 biometry is consistent with her previously-established dates.
 Amniotic fluid is normal and good fetal activity is seen.
 Patient reports she had screening for fetal aneuploidies. We
 do not have the results available. Obstetric history is
 significant for a term vaginal delivery of an infant weighing 5-
 2 at birth. No evidence of IUGR and the infant was
 discharged home with mother.

 Patient is transferring [facility].
Recommendations

 Follow-up as clinically indicated.
                 Oxendine, Nola

## 2019-04-29 IMAGING — US US MFM OB FOLLOW UP
1 series · 13 of 28 positions shown · non-contrast
Comparison: none

[Series 1: us mfm ob follow up · 32 acquisitions, 13 frames shown]
[im 2/32]
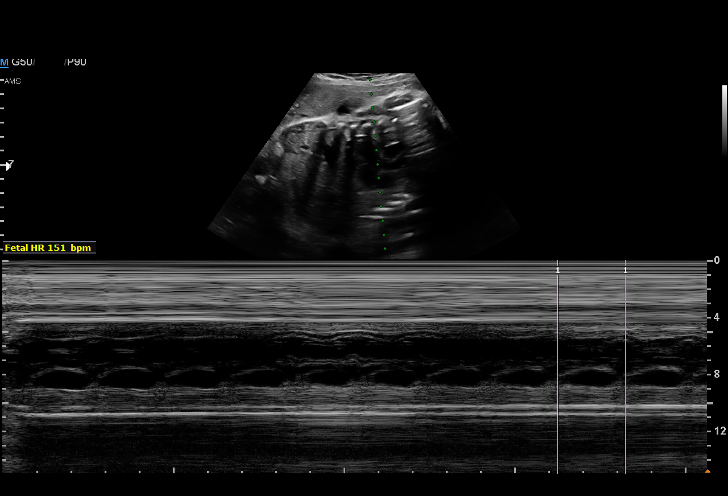
[im 4/32]
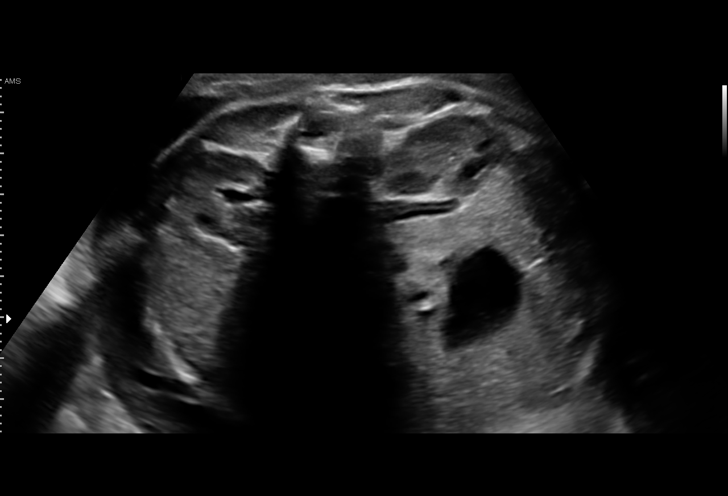
[im 6/32]
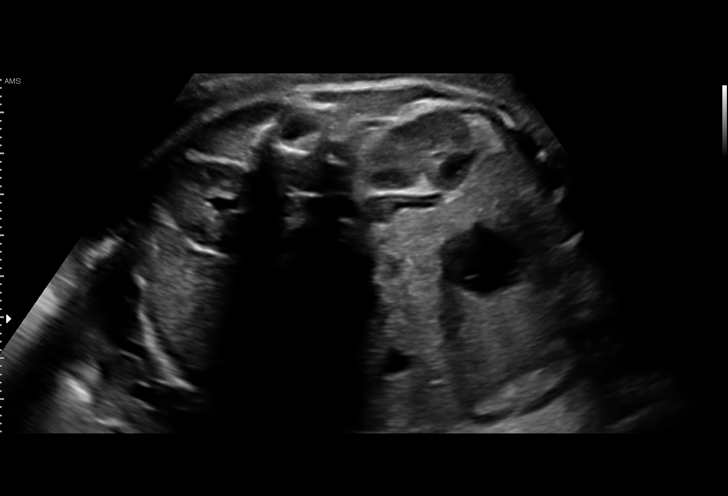
[im 9/32]
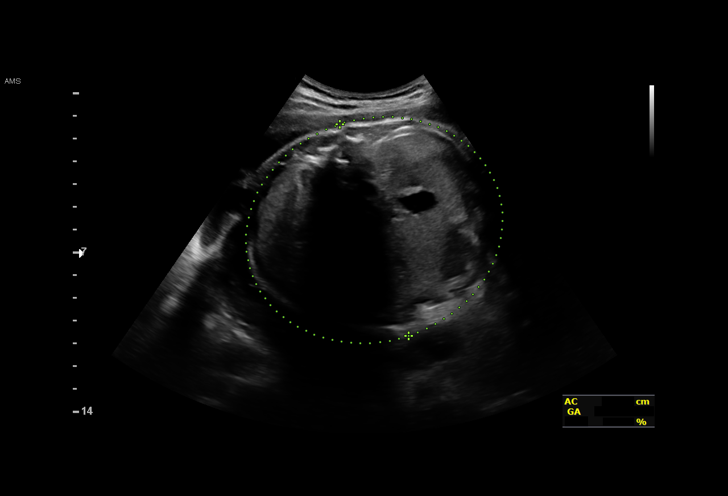
[im 11/32]
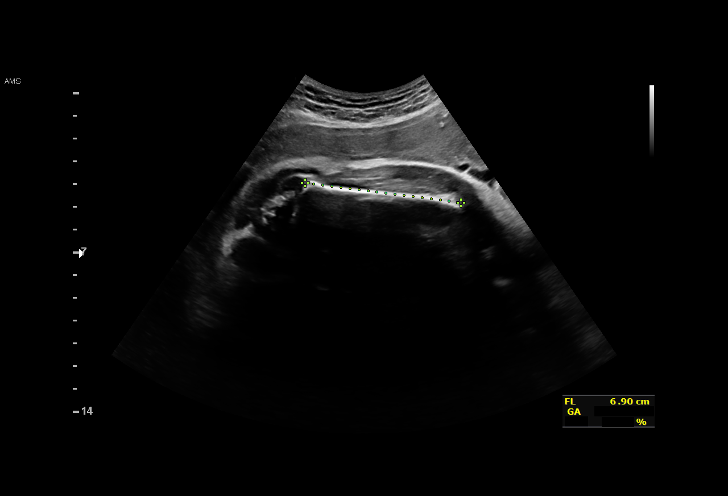
[im 13/32]
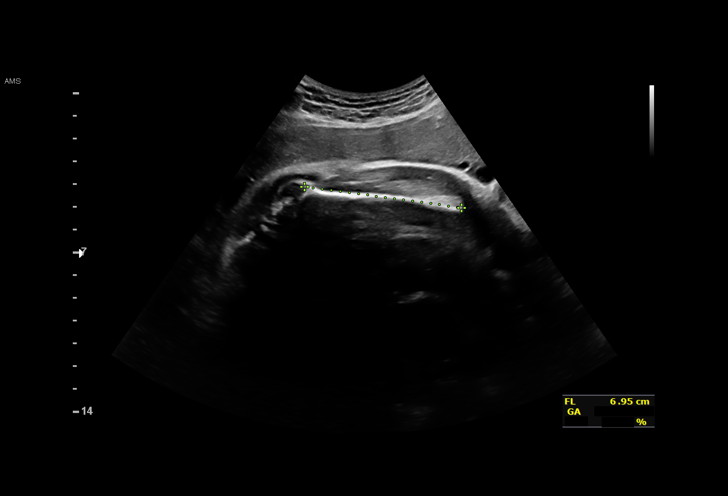
[im 17/32]
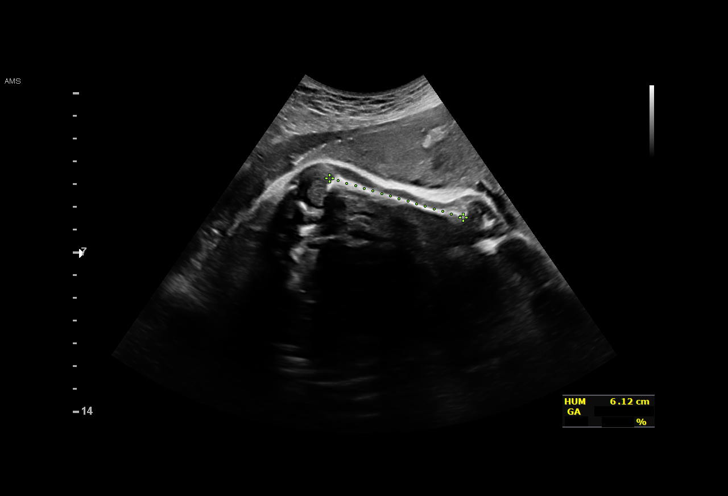
[im 19/32]
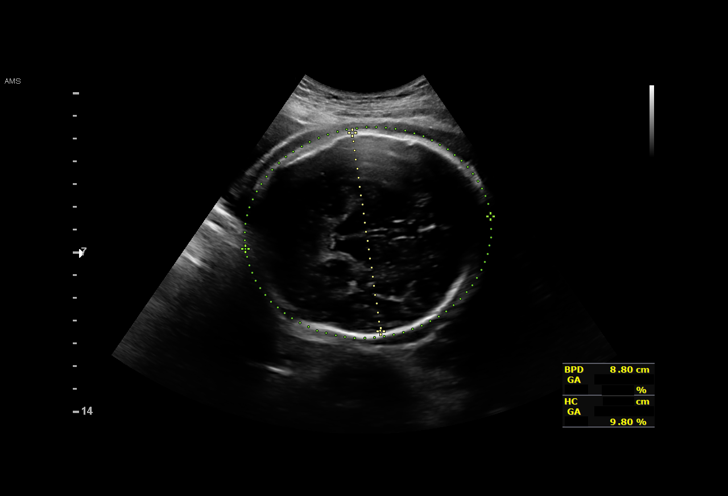
[im 21/32]
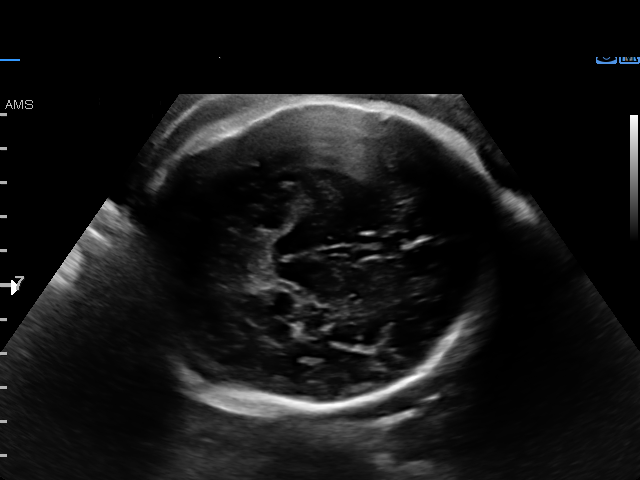
[im 23/32]
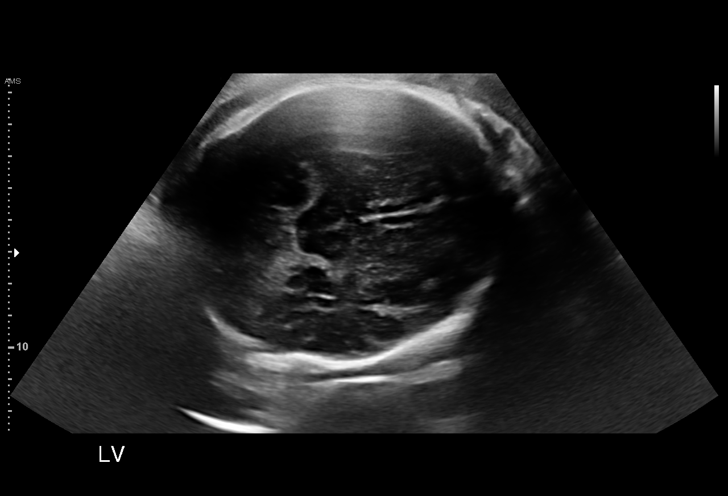
[im 26/32]
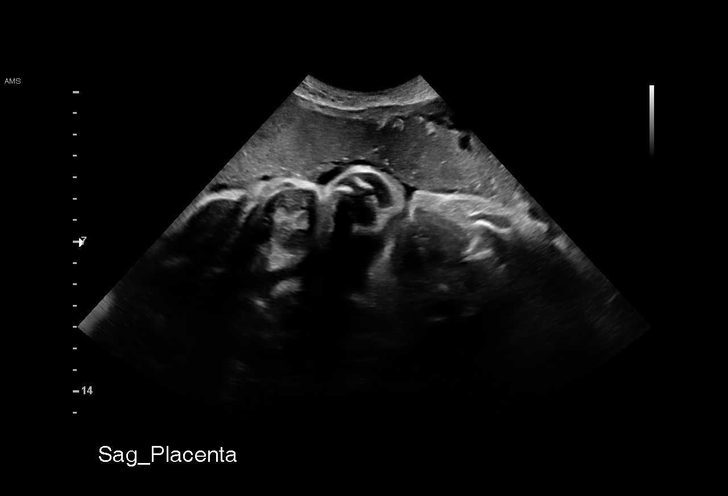
[im 28/32]
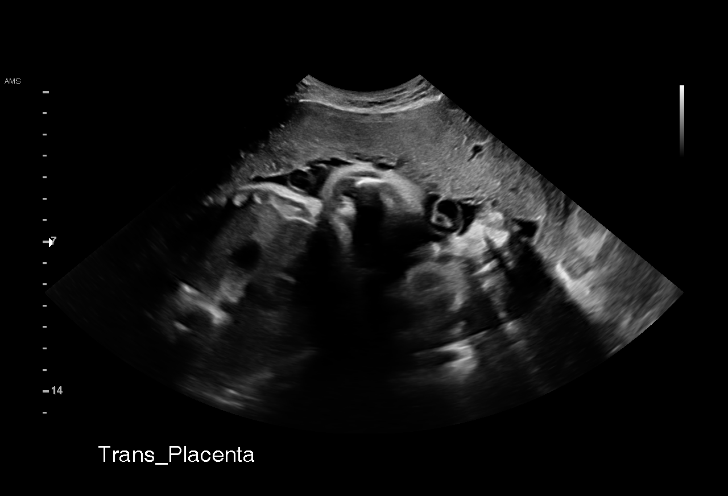
[im 30/32]
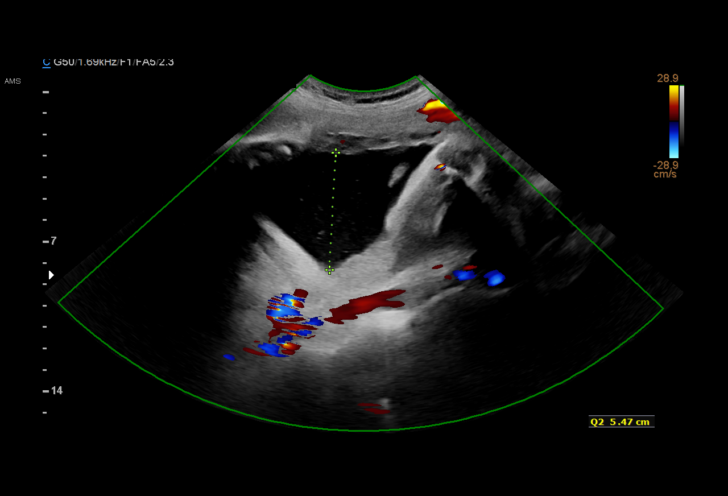

[13 of 28 positions shown; findings below may reference images not displayed]

[facility]

                                                            OB/Gyn Clinic

 ----------------------------------------------------------------------

 ----------------------------------------------------------------------
Indications

  Poor obstetric history: Previous fetal growth
  restriction (FGR) - term at 2324g
  Cerebral ventriculomegaly (bilateral 10 mm)
  36 weeks gestation of pregnancy
 ----------------------------------------------------------------------
Vital Signs

                                                Height:        5'3"
Fetal Evaluation

 Num Of Fetuses:         1
 Fetal Heart Rate(bpm):  151
 Cardiac Activity:       Observed
 Presentation:           Cephalic
 Placenta:               Anterior
 P. Cord Insertion:      Previously Visualized

 Amniotic Fluid
 AFI FV:      Within normal limits

 AFI Sum(cm)     %Tile       Largest Pocket(cm)
 14.48           53

 RUQ(cm)       RLQ(cm)       LUQ(cm)        LLQ(cm)

Biometry

 BPD:      87.6  mm     G. Age:  35w 3d         34  %    CI:        76.85   %    70 - 86
                                                         FL/HC:      21.9   %    20.1 -
 HC:      316.5  mm     G. Age:  35w 4d         10  %    HC/AC:      0.97        0.93 -
 AC:      325.1  mm     G. Age:  36w 3d         65  %    FL/BPD:     79.1   %    71 - 87
 FL:       69.3  mm     G. Age:  35w 4d         28  %    FL/AC:      21.3   %    20 - 24
 HUM:      61.3  mm     G. Age:  35w 3d         50  %

 Est. FW:    2222  gm      6 lb 4 oz     59  %
OB History

 Gravidity:    2         Term:   1        Prem:   0        SAB:   0
 TOP:          0       Ectopic:  0        Living: 1
Gestational Age

 LMP:           32w 0d        Date:  11/06/17                 EDD:   08/13/18
 U/S Today:     35w 5d                                        EDD:   07/18/18
 Best:          36w 2d     Det. By:  Previous Ultrasound      EDD:   07/14/18
                                     (02/04/18)
Anatomy

 Cranium:               Appears normal         Aortic Arch:            Previously seen
 Cavum:                 Appears normal         Ductal Arch:            Previously seen
 Ventricles:            Appears normal         Diaphragm:              Previously seen
 Choroid Plexus:        Previously seen        Stomach:                Appears normal, left
                                                                       sided
 Cerebellum:            Previously seen        Abdomen:                Appears normal
 Posterior Fossa:       Previously seen        Abdominal Wall:         Previously seen
 Nuchal Fold:           Previously seen        Cord Vessels:           Previously seen
 Face:                  Orbits and profile     Kidneys:                Appear normal
                        previously seen
 Lips:                  Previously seen        Bladder:                Appears normal
 Thoracic:              Appears normal         Spine:                  Previously seen
 Heart:                 Previously seen        Upper Extremities:      Previously seen
 RVOT:                  Previously seen        Lower Extremities:      Previously seen
 LVOT:                  Previously seen

 Other:  Male gender. Heels and 5th digit prev visualized. Nasal bone prev
         visualized.
Cervix Uterus Adnexa

 Cervix
 Not adaquately visualized

 Uterus
 No abnormality visualized.

 Left Ovary
 Not visualized.

 Right Ovary
 Not visualized.

 Cul De Sac
 No free fluid seen.
 Adnexa
 No abnormality visualized.
Impression

 Amniotic fluid is normal and good fetal activity is seen. Fetal
 growth is appropriate for gestational age. Lateral ventricles
 appear normal with no evidence of ventriculomegaly.
Recommendations

 Follow-up scans as clinically indicated.
                 Padam, Jhemboy

## 2020-02-11 ENCOUNTER — Other Ambulatory Visit: Payer: Self-pay

## 2020-02-11 ENCOUNTER — Encounter: Payer: Self-pay | Admitting: Nurse Practitioner

## 2020-02-11 ENCOUNTER — Ambulatory Visit: Payer: Self-pay | Attending: Nurse Practitioner | Admitting: Nurse Practitioner

## 2020-02-11 VITALS — Ht 63.0 in | Wt 140.0 lb

## 2020-02-11 DIAGNOSIS — Z7689 Persons encountering health services in other specified circumstances: Secondary | ICD-10-CM

## 2020-02-11 NOTE — Progress Notes (Signed)
Virtual Visit via Telephone Note Due to national recommendations of social distancing due to COVID 19, telehealth visit is felt to be most appropriate for this patient at this time.  I discussed the limitations, risks, security and privacy concerns of performing an evaluation and management service by telephone and the availability of in person appointments. I also discussed with the patient that there may be a patient responsible charge related to this service. The patient expressed understanding and agreed to proceed.    I connected with Tina Anthony on 02/11/20  at   1:50 PM EDT  EDT by telephone and verified that I am speaking with the correct person using two identifiers.   Consent I discussed the limitations, risks, security and privacy concerns of performing an evaluation and management service by telephone and the availability of in person appointments. I also discussed with the patient that there may be a patient responsible charge related to this service. The patient expressed understanding and agreed to proceed.   Location of Patient: Private Residence   [facility] Provider[facility] [place] [facility]    Persons participating in Telemedicine visit: Bertram Denver FNP-BC [facility] CMA Tina Anthony  Spanish Interpreter ID# Lars Mage [id number]   History of Present Illness: Telemedicine visit for: Establish Care  has a past medical history of Medical history non-contributory. Patient has been counseled on age-appropriate routine health concerns for screening and prevention. These are reviewed and up-to-date. Referrals have been placed accordingly. Immunizations are up-to-date or declined.    PAP SMEAR: Needs to schedule for next office visit.  She denies any history of DM, HTN, Thyroid disorder or Dyslipidemia. Denies chest pain, shortness of breath, palpitations, lightheadedness, dizziness, headaches or BLE edema.    Past Medical  History:  Diagnosis Date  . Medical history non-contributory     Past Surgical History:  Procedure Laterality Date  . NO PAST SURGERIES      History reviewed. No pertinent family history.  Social History   Socioeconomic History  . Marital status: Single    Spouse name: Not on file  . Number of children: Not on file  . Years of education: Not on file  . Highest education level: Not on file  Occupational History  . Not on file  Tobacco Use  . Smoking status: Never Smoker  . Smokeless tobacco: Never Used  Vaping Use  . Vaping Use: Never used  Substance and Sexual Activity  . Alcohol use: No  . Drug use: No  . Sexual activity: Yes    Birth control/protection: None  Other Topics Concern  . Not on file  Social History Narrative  . Not on file   Social Determinants of Health   Financial Resource Strain:   . Difficulty of Paying Living Expenses: Not on file  Food Insecurity:   . Worried About Programme researcher, broadcasting/film/video in the Last Year: Not on file  . Ran Out of Food in the Last Year: Not on file  Transportation Needs:   . Lack of Transportation (Medical): Not on file  . Lack of Transportation (Non-Medical): Not on file  Physical Activity:   . Days of Exercise per Week: Not on file  . Minutes of Exercise per Session: Not on file  Stress:   . Feeling of Stress : Not on file  Social Connections:   . Frequency of Communication with Friends and Family: Not on file  . Frequency of Social Gatherings with Friends and Family: Not on file  .  Attends Religious Services: Not on file  . Active Member of Clubs or Organizations: Not on file  . Attends Banker Meetings: Not on file  . Marital Status: Not on file     Observations/Objective: Awake, alert and oriented x 3   Review of Systems  Constitutional: Negative for fever, malaise/fatigue and weight loss.  HENT: Negative.  Negative for nosebleeds.   Eyes: Negative.  Negative for blurred vision, double vision and  photophobia.  Respiratory: Negative.  Negative for cough and shortness of breath.   Cardiovascular: Negative.  Negative for chest pain, palpitations and leg swelling.  Gastrointestinal: Negative.  Negative for heartburn, nausea and vomiting.  Musculoskeletal: Negative.  Negative for myalgias.  Neurological: Negative.  Negative for dizziness, focal weakness, seizures and headaches.  Psychiatric/Behavioral: Negative.  Negative for suicidal ideas.    Assessment and Plan: [name] was seen today for establish care.  Diagnoses and all orders for this visit:  Encounter to establish care  Follow Up Instructions Return for PAP SMEAR.     I discussed the assessment and treatment plan with the patient. The patient was provided an opportunity to ask questions and all were answered. The patient agreed with the plan and demonstrated an understanding of the instructions.   The patient was advised to call back or seek an in-person evaluation if the symptoms worsen or if the condition fails to improve as anticipated.  I provided 12 minutes of non-face-to-face time during this encounter including median intraservice time, reviewing previous notes, labs, imaging, medications and explaining diagnosis and management.  Claiborne Rigg, FNP-BC

## 2020-02-16 ENCOUNTER — Other Ambulatory Visit: Payer: Self-pay

## 2020-02-18 ENCOUNTER — Ambulatory Visit: Payer: Self-pay | Attending: Nurse Practitioner

## 2020-02-18 ENCOUNTER — Other Ambulatory Visit: Payer: Self-pay | Admitting: Nurse Practitioner

## 2020-02-18 ENCOUNTER — Other Ambulatory Visit: Payer: Self-pay

## 2020-02-18 DIAGNOSIS — Z13228 Encounter for screening for other metabolic disorders: Secondary | ICD-10-CM

## 2020-02-18 DIAGNOSIS — Z130 Encounter for screening for diseases of the blood and blood-forming organs and certain disorders involving the immune mechanism: Secondary | ICD-10-CM

## 2020-02-18 DIAGNOSIS — Z1329 Encounter for screening for other suspected endocrine disorder: Secondary | ICD-10-CM

## 2020-02-18 DIAGNOSIS — Z131 Encounter for screening for diabetes mellitus: Secondary | ICD-10-CM

## 2020-02-18 DIAGNOSIS — Z13220 Encounter for screening for lipoid disorders: Secondary | ICD-10-CM

## 2020-02-19 LAB — CMP14+EGFR
ALT: 10 IU/L (ref 0–32)
AST: 14 IU/L (ref 0–40)
Albumin/Globulin Ratio: 1.8 (ref 1.2–2.2)
Albumin: 4.4 g/dL (ref 3.9–5.0)
Alkaline Phosphatase: 75 IU/L (ref 44–121)
BUN/Creatinine Ratio: 25 — ABNORMAL HIGH (ref 9–23)
BUN: 15 mg/dL (ref 6–20)
Bilirubin Total: <0.2 mg/dL (ref 0.0–1.2)
CO2: 24 mmol/L (ref 20–29)
Calcium: 9.7 mg/dL (ref 8.7–10.2)
Chloride: 102 mmol/L (ref 96–106)
Creatinine, Ser: 0.59 mg/dL (ref 0.57–1.00)
GFR calc Af Amer: 142 mL/min/{1.73_m2} (ref >59)
GFR calc non Af Amer: 123 mL/min/{1.73_m2} (ref >59)
Globulin, Total: 2.5 g/dL (ref 1.5–4.5)
Glucose: 84 mg/dL (ref 65–99)
Potassium: 4 mmol/L (ref 3.5–5.2)
Sodium: 138 mmol/L (ref 134–144)
Total Protein: 6.9 g/dL (ref 6.0–8.5)

## 2020-02-19 LAB — HEMOGLOBIN A1C
Est. average glucose Bld gHb Est-mCnc: 117 mg/dL
Hgb A1c MFr Bld: 5.7 % — ABNORMAL HIGH (ref 4.8–5.6)

## 2020-02-19 LAB — CBC
Hematocrit: 40.1 % (ref 34.0–46.6)
Hemoglobin: 13.5 g/dL (ref 11.1–15.9)
MCH: 30.4 pg (ref 26.6–33.0)
MCHC: 33.7 g/dL (ref 31.5–35.7)
MCV: 90 fL (ref 79–97)
Platelets: 249 10*3/uL (ref 150–450)
RBC: 4.44 x10E6/uL (ref 3.77–5.28)
RDW: 12.2 % (ref 11.7–15.4)
WBC: 8.9 10*3/uL (ref 3.4–10.8)

## 2020-02-19 LAB — LIPID PANEL
Chol/HDL Ratio: 4.9 ratio — ABNORMAL HIGH (ref 0.0–4.4)
Cholesterol, Total: 212 mg/dL — ABNORMAL HIGH (ref 100–199)
HDL: 43 mg/dL (ref >39)
LDL Chol Calc (NIH): 106 mg/dL — ABNORMAL HIGH (ref 0–99)
Triglycerides: 372 mg/dL — ABNORMAL HIGH (ref 0–149)
VLDL Cholesterol Cal: 63 mg/dL — ABNORMAL HIGH (ref 5–40)

## 2020-02-19 LAB — TSH: TSH: 2.01 u[IU]/mL (ref 0.450–4.500)

## 2020-03-03 ENCOUNTER — Other Ambulatory Visit: Payer: Self-pay | Admitting: Nurse Practitioner

## 2020-03-03 MED ORDER — ATORVASTATIN CALCIUM 20 MG PO TABS: 20.0000 mg | ORAL_TABLET | Freq: Every day | ORAL | 3 refills | Status: DC

## 2020-03-03 MED FILL — ATORVASTATIN CALCIUM 20 MG: 20 | 30 days supply | Qty: 30 | Fill #0

## 2020-03-24 ENCOUNTER — Ambulatory Visit: Payer: Self-pay | Attending: Nurse Practitioner | Admitting: Nurse Practitioner

## 2020-03-24 ENCOUNTER — Encounter: Payer: Self-pay | Admitting: Nurse Practitioner

## 2020-03-24 ENCOUNTER — Other Ambulatory Visit: Payer: Self-pay

## 2020-03-24 VITALS — BP 114/74 | HR 68 | Temp 98.3°F | Ht 63.39 in | Wt 145.0 lb

## 2020-03-24 DIAGNOSIS — Z124 Encounter for screening for malignant neoplasm of cervix: Secondary | ICD-10-CM

## 2020-03-24 DIAGNOSIS — Z1159 Encounter for screening for other viral diseases: Secondary | ICD-10-CM

## 2020-03-24 DIAGNOSIS — Z114 Encounter for screening for human immunodeficiency virus [HIV]: Secondary | ICD-10-CM

## 2020-03-24 NOTE — Progress Notes (Signed)
Assessment & Plan:  Tina Anthony was seen today for gynecologic exam.  Diagnoses and all orders for this visit:  Encounter for Papanicolaou smear for cervical cancer screening -     Cytology - PAP -     Cervicovaginal ancillary only  Encounter for screening for HIV -     HIV antibody (with reflex)  Need for hepatitis C screening test -     HCV Ab w Reflex to Quant PCR    Patient has been counseled on age-appropriate routine health concerns for screening and prevention. These are reviewed and up-to-date. Referrals have been placed accordingly. Immunizations are up-to-date or declined.    Subjective:   Chief Complaint  Patient presents with  . Gynecologic Exam    Pt. Is here for a pap smear.    HPI Tina Anthony 29 y.o. female presents to office today for PAP smear.   Review of Systems  Constitutional: Negative.  Negative for chills, fever, malaise/fatigue and weight loss.  Respiratory: Negative.  Negative for cough, shortness of breath and wheezing.   Cardiovascular: Negative.  Negative for chest pain, orthopnea and leg swelling.  Gastrointestinal: Negative for abdominal pain.  Genitourinary: Negative.  Negative for flank pain.  Skin: Negative.  Negative for rash.  Psychiatric/Behavioral: Negative for suicidal ideas.    Past Medical History:  Diagnosis Date  . Medical history non-contributory     Past Surgical History:  Procedure Laterality Date  . NO PAST SURGERIES      History reviewed. No pertinent family history.  Social History Reviewed with no changes to be made today.   Outpatient Medications Prior to Visit  Medication Sig Dispense Refill  . atorvastatin (LIPITOR) 20 MG tablet Take 1 tablet (20 mg total) by mouth daily. (Patient not taking: Reported on 03/24/2020) 90 tablet 3  . Prenatal Vit-Fe Fumarate-FA (PRENATAL VITAMIN) 27-0.8 MG TABS Take 1 tablet by mouth daily. (Patient not taking: No sig reported) 60 tablet 3   No  facility-administered medications prior to visit.    No Known Allergies     Objective:    BP 114/74 (BP Location: Left Arm, Patient Position: Sitting, Cuff Size: Normal)   Pulse 68   Temp 98.3 F (36.8 C) (Oral)   Ht 5' 3.39" (1.61 m)   Wt 145 lb (65.8 kg)   SpO2 100%   BMI 25.37 kg/m  Wt Readings from Last 3 Encounters:  03/24/20 145 lb (65.8 kg)  02/11/20 140 lb (63.5 kg)  07/08/18 138 lb 12.8 oz (63 kg)    Physical Exam Constitutional:      Appearance: She is well-developed and well-nourished.  HENT:     Head: Normocephalic.  Cardiovascular:     Rate and Rhythm: Normal rate and regular rhythm.     Heart sounds: Normal heart sounds.  Pulmonary:     Effort: Pulmonary effort is normal.     Breath sounds: Normal breath sounds.  Abdominal:     General: Bowel sounds are normal.     Palpations: Abdomen is soft.     Hernia: There is no hernia in the right inguinal area or left inguinal area.  Genitourinary:    Labia: No labial fusion.        Right: No rash, tenderness, lesion or injury.        Left: No rash, tenderness, lesion or injury.      Vagina: Normal. No signs of injury and foreign body. No vaginal discharge, erythema, tenderness or bleeding.  Cervix: No cervical motion tenderness or friability.     Uterus: Normal. Not deviated and not enlarged.      Adnexa:        Right: No mass, tenderness or fullness.         Left: No mass, tenderness or fullness.       Rectum: Normal. No external hemorrhoid.  Lymphadenopathy:     Lower Body: No right inguinal and no right inguinal adenopathy. No left inguinal and no left inguinal adenopathy.  Skin:    General: Skin is warm and dry.  Neurological:     Mental Status: She is alert and oriented to person, place, and time.  Psychiatric:        Mood and Affect: Mood and affect normal.        Behavior: Behavior normal.        Thought Content: Thought content normal.        Judgment: Judgment normal.          Patient  has been counseled extensively about nutrition and exercise as well as the importance of adherence with medications and regular follow-up. The patient was given clear instructions to go to ER or return to medical center if symptoms don't improve, worsen or new problems develop. The patient verbalized understanding.   Follow-up: Return in about 5 months (around 08/22/2020), or if symptoms worsen or fail to improve, for A1c prediabetes.   Claiborne Rigg, FNP-BC [facility] [place], [place] [phone]   03/24/2020, 4:03 PM

## 2020-03-25 LAB — CERVICOVAGINAL ANCILLARY ONLY
Bacterial Vaginitis (gardnerella): NEGATIVE
Candida Glabrata: NEGATIVE
Candida Vaginitis: NEGATIVE
Chlamydia: NEGATIVE
Comment: NEGATIVE
Comment: NEGATIVE
Comment: NEGATIVE
Comment: NEGATIVE
Comment: NEGATIVE
Comment: NORMAL
Neisseria Gonorrhea: NEGATIVE
Trichomonas: NEGATIVE

## 2020-03-26 LAB — CYTOLOGY - PAP
Comment: NEGATIVE
Diagnosis: NEGATIVE
High risk HPV: NEGATIVE

## 2020-05-31 ENCOUNTER — Telehealth: Payer: Self-pay | Admitting: Nurse Practitioner

## 2020-05-31 NOTE — Telephone Encounter (Signed)
I return Pt call, schedule a financial appt for 06/09/20

## 2020-05-31 NOTE — Telephone Encounter (Signed)
Copied from CRM [id number]. Topic: General - Call Back - No Documentation >> May 31, 2020 11:31 AM Tina Anthony, Rosey Bath D wrote: Reason for CRM: Patient is calling and would like to speak with Mikle Bosworth about the orange card for new application. Please return pt call back, thanks.

## 2020-06-09 ENCOUNTER — Other Ambulatory Visit: Payer: Self-pay

## 2020-06-09 ENCOUNTER — Ambulatory Visit: Payer: Self-pay | Attending: Nurse Practitioner

## 2020-06-22 NOTE — Telephone Encounter (Addendum)
Patient is checking on the status of Dentist referral due to being apprvoed for the orange card best # [phone]

## 2020-06-24 ENCOUNTER — Telehealth: Payer: Self-pay | Admitting: Nurse Practitioner

## 2020-06-24 NOTE — Telephone Encounter (Signed)
Pt asking for referral for dentist. Pt states she's been having pain with warm foods only, not cold and has been having some bleeding from the gums lately.

## 2020-06-24 NOTE — Telephone Encounter (Signed)
Pt has the OC. Will send to provider to place referral

## 2020-06-26 ENCOUNTER — Other Ambulatory Visit: Payer: Self-pay | Admitting: Nurse Practitioner

## 2020-06-26 DIAGNOSIS — R6889 Other general symptoms and signs: Secondary | ICD-10-CM

## 2020-06-26 NOTE — Telephone Encounter (Signed)
Referral has been sent. Will likely be months before she is seen. She can purchase sensodyne toothpaste for this in the meantime.

## 2020-06-26 NOTE — Telephone Encounter (Signed)
It takes months to be seen by the dentist. The referral has been placed.

## 2020-06-28 NOTE — Telephone Encounter (Signed)
Please contact pt and make aware  °

## 2020-09-22 ENCOUNTER — Other Ambulatory Visit: Payer: Self-pay

## 2020-09-22 ENCOUNTER — Encounter: Payer: Self-pay | Admitting: Nurse Practitioner

## 2020-09-22 ENCOUNTER — Ambulatory Visit: Payer: Self-pay | Attending: Nurse Practitioner | Admitting: Nurse Practitioner

## 2020-09-22 DIAGNOSIS — L918 Other hypertrophic disorders of the skin: Secondary | ICD-10-CM

## 2020-09-22 DIAGNOSIS — R7303 Prediabetes: Secondary | ICD-10-CM

## 2020-09-22 DIAGNOSIS — Z30011 Encounter for initial prescription of contraceptive pills: Secondary | ICD-10-CM

## 2020-09-22 MED ORDER — NORGESTIM-ETH ESTRAD TRIPHASIC 0.18/0.215/0.25 MG-35 MCG PO TABS
1.0000 | ORAL_TABLET | Freq: Every day | ORAL | 11 refills | Status: AC
  Filled 2020-09-22 – 2020-12-08 (×2): qty 28, 28d supply, fill #0

## 2020-09-22 NOTE — Progress Notes (Signed)
Virtual Visit via Telephone Note Due to national recommendations of social distancing due to COVID 19, telehealth visit is felt to be most appropriate for this patient at this time.  I discussed the limitations, risks, security and privacy concerns of performing an evaluation and management service by telephone and the availability of in person appointments. I also discussed with the patient that there may be a patient responsible charge related to this service. The patient expressed understanding and agreed to proceed.    I connected with Tina Anthony on 09/22/20  at   2:10 PM EDT  EDT by telephone and verified that I am speaking with the correct person using two identifiers.  Location of Patient: Private Residence   [facility] Provider[facility]    Persons participating in Telemedicine visit: Tina Denver FNP-BC Tina Anthony  Spanish Interpreter Tina Anthony [phone]   History of Present Illness: Telemedicine visit for: Oral Contraception Last Menstrual cycle. Last menstrual cycle was June 3rd. Lasted 4 days. She would like to start birth control pills today.    Skin Tags On both sides of neck. She would like to have them removed. They can sometimes catch on her clothing or necklaces and cause pain.   Past Medical History:  Diagnosis Date  . Medical history non-contributory     Past Surgical History:  Procedure Laterality Date  . NO PAST SURGERIES      History reviewed. No pertinent family history.  Social History   Socioeconomic History  . Marital status: Single    Spouse name: Not on file  . Number of children: Not on file  . Years of education: Not on file  . Highest education level: Not on file  Occupational History  . Not on file  Tobacco Use  . Smoking status: Never  . Smokeless tobacco: Never  Vaping Use  . Vaping Use: Never used  Substance and Sexual Activity  . Alcohol use: No  . Drug use:  No  . Sexual activity: Yes    Birth control/protection: None  Other Topics Concern  . Not on file  Social History Narrative  . Not on file   Social Determinants of Health   Financial Resource Strain: Not on file  Food Insecurity: Not on file  Transportation Needs: Not on file  Physical Activity: Not on file  Stress: Not on file  Social Connections: Not on file     Observations/Objective: Awake, alert and oriented x 3   Review of Systems  Constitutional:  Negative for fever, malaise/fatigue and weight loss.  HENT: Negative.  Negative for nosebleeds.   Eyes: Negative.  Negative for blurred vision, double vision and photophobia.  Respiratory: Negative.  Negative for cough and shortness of breath.   Cardiovascular: Negative.  Negative for chest pain, palpitations and leg swelling.  Gastrointestinal: Negative.  Negative for heartburn, nausea and vomiting. Abdominal pain: tele. Musculoskeletal: Negative.  Negative for myalgias.  Skin:        Skin tags  Neurological: Negative.  Negative for dizziness, focal weakness, seizures and headaches.  Psychiatric/Behavioral: Negative.  Negative for suicidal ideas.    Assessment and Plan: Tina Anthony was seen today for contraception.  Diagnoses and all orders for this visit:  Oral contraception initiation -     Norgestimate-Ethinyl Estradiol Triphasic (ORTHO TRI-CYCLEN, 28,) 0.18/0.215/0.25 MG-35 MCG tablet; Take 1 tablet by mouth daily. -     POCT urine pregnancy; Future  Prediabetes -     Basic metabolic panel; Future -  Hemoglobin A1c; Future  Skin tags, multiple acquired -     Ambulatory referral to Dermatology    Follow Up Instructions Return for Physical ONLY no labs.     I discussed the assessment and treatment plan with the patient. The patient was provided an opportunity to ask questions and all were answered. The patient agreed with the plan and demonstrated an understanding of the instructions.   The patient was advised  to call back or seek an in-person evaluation if the symptoms worsen or if the condition fails to improve as anticipated.  I provided 15 minutes of non-face-to-face time during this encounter including median intraservice time, reviewing previous notes, labs, imaging, medications and explaining diagnosis and management.  Claiborne Rigg, FNP-BC

## 2020-09-22 NOTE — Progress Notes (Signed)
Discuss taking pills.

## 2020-09-29 ENCOUNTER — Other Ambulatory Visit: Payer: Self-pay

## 2020-12-08 ENCOUNTER — Other Ambulatory Visit: Payer: Self-pay

## 2020-12-08 ENCOUNTER — Ambulatory Visit: Payer: Self-pay | Attending: Nurse Practitioner

## 2020-12-08 DIAGNOSIS — R7303 Prediabetes: Secondary | ICD-10-CM

## 2020-12-09 ENCOUNTER — Telehealth: Payer: Self-pay | Admitting: Nurse Practitioner

## 2020-12-09 LAB — BASIC METABOLIC PANEL
BUN/Creatinine Ratio: 11 (ref 9–23)
BUN: 8 mg/dL (ref 6–20)
CO2: 23 mmol/L (ref 20–29)
Calcium: 9.6 mg/dL (ref 8.7–10.2)
Chloride: 105 mmol/L (ref 96–106)
Creatinine, Ser: 0.74 mg/dL (ref 0.57–1.00)
Glucose: 79 mg/dL (ref 65–99)
Potassium: 3.9 mmol/L (ref 3.5–5.2)
Sodium: 141 mmol/L (ref 134–144)
eGFR: 111 mL/min/{1.73_m2} (ref >59)

## 2020-12-09 LAB — HEMOGLOBIN A1C
Est. average glucose Bld gHb Est-mCnc: 108 mg/dL
Hgb A1c MFr Bld: 5.4 % (ref 4.8–5.6)

## 2020-12-09 NOTE — Telephone Encounter (Signed)
I return Pt call, explain her what she need to qualified for the financial program

## 2020-12-09 NOTE — Telephone Encounter (Signed)
Copied from CRM [id number]. Topic: General - Other >> Dec 08, 2020  7:49 AM Traci Sermon wrote: Reason for CRM: Pt called in stating she wanted to speak with carlos, and asked if he could give her a call back, please advise

## 2020-12-15 ENCOUNTER — Telehealth: Payer: Self-pay | Admitting: *Deleted

## 2020-12-15 ENCOUNTER — Other Ambulatory Visit: Payer: Self-pay

## 2020-12-15 NOTE — Telephone Encounter (Signed)
Reviewed lab results and provider's note with the patient via interpreter [phone]. Patient having headaches with drinking regular coffee in the mornings.  Advised switching to decaf coffee, drinking less coffee and at least 4-5 glasses water daily. If no improvement with changes or worsens call back for appointment.

## 2020-12-17 ENCOUNTER — Other Ambulatory Visit: Payer: Self-pay

## 2020-12-17 ENCOUNTER — Ambulatory Visit: Payer: Self-pay | Attending: Nurse Practitioner

## 2021-01-17 ENCOUNTER — Encounter: Payer: Self-pay | Admitting: Nurse Practitioner

## 2021-06-17 ENCOUNTER — Telehealth: Payer: Self-pay | Admitting: Nurse Practitioner

## 2021-06-17 NOTE — Telephone Encounter (Signed)
Copied from [place] [id number]. Topic: General - Other ?>> Jun 14, 2021  9:50 AM McGill, Nelva Bush wrote: ?Reason for CRM: Pt requested a call back to schedule an appointment with Clifton James for financial assistance. ?

## 2021-06-21 NOTE — Telephone Encounter (Signed)
I return Pt call, she will call back to schedule a financial appt ?

## 2021-08-05 ENCOUNTER — Encounter: Payer: Self-pay | Admitting: Nurse Practitioner

## 2021-11-01 NOTE — Telephone Encounter (Deleted)
error 

## 2021-11-25 NOTE — Telephone Encounter (Deleted)
Error

## 2022-05-09 NOTE — Telephone Encounter (Signed)
error
